# Patient Record
Sex: Male | Born: 1970 | Race: Black or African American | Hispanic: No | Marital: Married | Smoking: Current every day smoker
Health system: Southern US, Community
[De-identification: ages and names within clinical notes are randomized; demographics above are authoritative.]

## PROBLEM LIST (undated history)

## (undated) ENCOUNTER — Emergency Department (HOSPITAL_COMMUNITY): Admission: EM | Payer: Self-pay

## (undated) DIAGNOSIS — G629 Polyneuropathy, unspecified: Secondary | ICD-10-CM

## (undated) DIAGNOSIS — H5440 Blindness, one eye, unspecified eye: Secondary | ICD-10-CM

## (undated) HISTORY — PX: OTHER SURGICAL HISTORY: SHX169

---

## 1997-09-08 ENCOUNTER — Emergency Department (HOSPITAL_COMMUNITY): Admission: EM | Admit: 1997-09-08 | Discharge: 1997-09-08 | Payer: Self-pay | Admitting: Emergency Medicine

## 1998-09-16 ENCOUNTER — Emergency Department (HOSPITAL_COMMUNITY): Admission: EM | Admit: 1998-09-16 | Discharge: 1998-09-16 | Payer: Self-pay | Admitting: Emergency Medicine

## 1999-08-13 ENCOUNTER — Emergency Department (HOSPITAL_COMMUNITY): Admission: EM | Admit: 1999-08-13 | Discharge: 1999-08-13 | Payer: Self-pay | Admitting: Emergency Medicine

## 1999-08-14 ENCOUNTER — Emergency Department (HOSPITAL_COMMUNITY): Admission: EM | Admit: 1999-08-14 | Discharge: 1999-08-14 | Payer: Self-pay | Admitting: Emergency Medicine

## 2000-05-11 ENCOUNTER — Encounter: Payer: Self-pay | Admitting: Emergency Medicine

## 2000-05-11 ENCOUNTER — Emergency Department (HOSPITAL_COMMUNITY): Admission: EM | Admit: 2000-05-11 | Discharge: 2000-05-11 | Payer: Self-pay | Admitting: Emergency Medicine

## 2001-06-01 ENCOUNTER — Emergency Department (HOSPITAL_COMMUNITY): Admission: EM | Admit: 2001-06-01 | Discharge: 2001-06-01 | Payer: Self-pay | Admitting: Emergency Medicine

## 2002-09-11 ENCOUNTER — Encounter: Payer: Self-pay | Admitting: Emergency Medicine

## 2002-09-11 ENCOUNTER — Emergency Department (HOSPITAL_COMMUNITY): Admission: EM | Admit: 2002-09-11 | Discharge: 2002-09-11 | Payer: Self-pay | Admitting: Emergency Medicine

## 2004-09-01 ENCOUNTER — Emergency Department (HOSPITAL_COMMUNITY): Admission: EM | Admit: 2004-09-01 | Discharge: 2004-09-01 | Payer: Self-pay | Admitting: Emergency Medicine

## 2004-10-09 ENCOUNTER — Emergency Department (HOSPITAL_COMMUNITY): Admission: EM | Admit: 2004-10-09 | Discharge: 2004-10-09 | Payer: Self-pay | Admitting: Family Medicine

## 2005-02-08 ENCOUNTER — Emergency Department (HOSPITAL_COMMUNITY): Admission: EM | Admit: 2005-02-08 | Discharge: 2005-02-08 | Payer: Self-pay | Admitting: Emergency Medicine

## 2006-12-13 ENCOUNTER — Emergency Department (HOSPITAL_COMMUNITY): Admission: EM | Admit: 2006-12-13 | Discharge: 2006-12-13 | Payer: Self-pay | Admitting: Emergency Medicine

## 2007-02-24 ENCOUNTER — Emergency Department (HOSPITAL_COMMUNITY): Admission: EM | Admit: 2007-02-24 | Discharge: 2007-02-24 | Payer: Self-pay | Admitting: Emergency Medicine

## 2009-11-12 ENCOUNTER — Emergency Department (HOSPITAL_COMMUNITY): Admission: EM | Admit: 2009-11-12 | Discharge: 2009-11-12 | Payer: Self-pay | Admitting: Emergency Medicine

## 2009-11-14 ENCOUNTER — Emergency Department (HOSPITAL_COMMUNITY): Admission: EM | Admit: 2009-11-14 | Discharge: 2009-11-14 | Payer: Self-pay | Admitting: Emergency Medicine

## 2010-01-30 ENCOUNTER — Emergency Department (HOSPITAL_COMMUNITY)
Admission: EM | Admit: 2010-01-30 | Discharge: 2010-01-30 | Payer: Self-pay | Source: Home / Self Care | Admitting: Emergency Medicine

## 2010-05-01 LAB — URINALYSIS, ROUTINE W REFLEX MICROSCOPIC
Bilirubin Urine: NEGATIVE
Glucose, UA: NEGATIVE mg/dL
Hgb urine dipstick: NEGATIVE
Hgb urine dipstick: NEGATIVE
Ketones, ur: NEGATIVE mg/dL
Nitrite: NEGATIVE
Protein, ur: NEGATIVE mg/dL
Protein, ur: NEGATIVE mg/dL
Urobilinogen, UA: 2 mg/dL — ABNORMAL HIGH (ref 0.0–1.0)

## 2010-05-01 LAB — DIFFERENTIAL
Eosinophils Absolute: 0.1 10*3/uL (ref 0.0–0.7)
Lymphocytes Relative: 27 % (ref 12–46)
Lymphs Abs: 1 10*3/uL (ref 0.7–4.0)
Neutrophils Relative %: 62 % (ref 43–77)

## 2010-05-01 LAB — POCT I-STAT, CHEM 8
Creatinine, Ser: 1.2 mg/dL (ref 0.4–1.5)
HCT: 48 % (ref 39.0–52.0)
Hemoglobin: 16.3 g/dL (ref 13.0–17.0)
Sodium: 139 mEq/L (ref 135–145)
TCO2: 30 mmol/L (ref 0–100)

## 2010-05-01 LAB — BASIC METABOLIC PANEL
Calcium: 9.1 mg/dL (ref 8.4–10.5)
Creatinine, Ser: 1.06 mg/dL (ref 0.4–1.5)
GFR calc Af Amer: >60 mL/min (ref >60)
GFR calc non Af Amer: >60 mL/min (ref >60)

## 2010-05-01 LAB — ETHANOL: Alcohol, Ethyl (B): <5 mg/dL (ref 0–10)

## 2010-05-01 LAB — CBC
Platelets: 203 10*3/uL (ref 150–400)
RBC: 4.48 MIL/uL (ref 4.22–5.81)
WBC: 3.6 10*3/uL — ABNORMAL LOW (ref 4.0–10.5)

## 2010-05-01 LAB — RAPID URINE DRUG SCREEN, HOSP PERFORMED
Amphetamines: NOT DETECTED
Benzodiazepines: NOT DETECTED

## 2011-03-20 ENCOUNTER — Emergency Department (HOSPITAL_COMMUNITY): Payer: Self-pay

## 2011-03-20 ENCOUNTER — Emergency Department (HOSPITAL_COMMUNITY)
Admission: EM | Admit: 2011-03-20 | Discharge: 2011-03-20 | Disposition: A | Discharge status: Home / Self Care | Payer: Self-pay | Attending: Emergency Medicine | Admitting: Emergency Medicine

## 2011-03-20 ENCOUNTER — Encounter (HOSPITAL_COMMUNITY): Payer: Self-pay | Admitting: Emergency Medicine

## 2011-03-20 DIAGNOSIS — K089 Disorder of teeth and supporting structures, unspecified: Secondary | ICD-10-CM | POA: Insufficient documentation

## 2011-03-20 DIAGNOSIS — R220 Localized swelling, mass and lump, head: Secondary | ICD-10-CM | POA: Insufficient documentation

## 2011-03-20 DIAGNOSIS — K047 Periapical abscess without sinus: Secondary | ICD-10-CM | POA: Insufficient documentation

## 2011-03-20 DIAGNOSIS — R6884 Jaw pain: Secondary | ICD-10-CM | POA: Insufficient documentation

## 2011-03-20 DIAGNOSIS — F17200 Nicotine dependence, unspecified, uncomplicated: Secondary | ICD-10-CM | POA: Insufficient documentation

## 2011-03-20 MED ORDER — PENICILLIN V POTASSIUM 500 MG PO TABS: 500.0000 mg | ORAL_TABLET | Freq: Four times a day (QID) | ORAL | Status: AC

## 2011-03-20 MED ORDER — OXYCODONE-ACETAMINOPHEN 5-325 MG PO TABS: 1.0000 | ORAL_TABLET | ORAL | Status: AC | PRN
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
## 2011-03-20 NOTE — ED Notes (Signed)
Left lower tooth pain started  X 1 weeks now jaw is swollen

## 2013-07-23 ENCOUNTER — Emergency Department (HOSPITAL_COMMUNITY): Payer: No Typology Code available for payment source

## 2013-07-23 ENCOUNTER — Encounter (HOSPITAL_COMMUNITY): Payer: Self-pay | Admitting: Emergency Medicine

## 2013-07-23 ENCOUNTER — Emergency Department (HOSPITAL_COMMUNITY)
Admission: EM | Admit: 2013-07-23 | Discharge: 2013-07-23 | Disposition: A | Discharge status: Home / Self Care | Payer: No Typology Code available for payment source | Attending: Emergency Medicine | Admitting: Emergency Medicine

## 2013-07-23 DIAGNOSIS — S79929A Unspecified injury of unspecified thigh, initial encounter: Secondary | ICD-10-CM

## 2013-07-23 DIAGNOSIS — S99929A Unspecified injury of unspecified foot, initial encounter: Principal | ICD-10-CM

## 2013-07-23 DIAGNOSIS — S79919A Unspecified injury of unspecified hip, initial encounter: Secondary | ICD-10-CM | POA: Insufficient documentation

## 2013-07-23 DIAGNOSIS — S8990XA Unspecified injury of unspecified lower leg, initial encounter: Secondary | ICD-10-CM | POA: Insufficient documentation

## 2013-07-23 DIAGNOSIS — F17200 Nicotine dependence, unspecified, uncomplicated: Secondary | ICD-10-CM | POA: Insufficient documentation

## 2013-07-23 DIAGNOSIS — Y92410 Unspecified street and highway as the place of occurrence of the external cause: Secondary | ICD-10-CM | POA: Insufficient documentation

## 2013-07-23 DIAGNOSIS — S20219A Contusion of unspecified front wall of thorax, initial encounter: Secondary | ICD-10-CM

## 2013-07-23 DIAGNOSIS — M25562 Pain in left knee: Secondary | ICD-10-CM

## 2013-07-23 DIAGNOSIS — Y9389 Activity, other specified: Secondary | ICD-10-CM | POA: Insufficient documentation

## 2013-07-23 DIAGNOSIS — IMO0002 Reserved for concepts with insufficient information to code with codable children: Secondary | ICD-10-CM | POA: Insufficient documentation

## 2013-07-23 DIAGNOSIS — S99919A Unspecified injury of unspecified ankle, initial encounter: Principal | ICD-10-CM

## 2013-07-23 MED ORDER — HYDROCODONE-ACETAMINOPHEN 5-500 MG PO TABS: 1.0000 | ORAL_TABLET | Freq: Four times a day (QID) | ORAL | Status: DC | PRN

## 2013-07-23 MED ORDER — HYDROCODONE-ACETAMINOPHEN 5-325 MG PO TABS
2.0000 | ORAL_TABLET | Freq: Once | ORAL | Status: AC
  Administered 2013-07-23: 2 via ORAL
  Filled 2013-07-23: qty 2

## 2013-07-23 MED ORDER — NAPROXEN 500 MG PO TABS: 500.0000 mg | ORAL_TABLET | Freq: Two times a day (BID) | ORAL | Status: DC
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
## 2013-07-23 NOTE — Discharge Instructions (Signed)
RICE: Routine Care for Injuries The routine care of many injuries includes Rest, Ice, Compression, and Elevation (RICE). HOME CARE INSTRUCTIONS  Rest is needed to allow your body to heal. Routine activities can usually be resumed when comfortable. Injured tendons and bones can take up to 6 weeks to heal. Tendons are the cord-like structures that attach muscle to bone.  Ice following an injury helps keep the swelling down and reduces pain.  Put ice in a plastic bag.  Place a towel between your skin and the bag.  Leave the ice on for 15-20 minutes, 03-04 times a day. Do this while awake, for the first 24 to 48 hours. After that, continue as directed by your caregiver.  Compression helps keep swelling down. It also gives support and helps with discomfort. If an elastic bandage has been applied, it should be removed and reapplied every 3 to 4 hours. It should not be applied tightly, but firmly enough to keep swelling down. Watch fingers or toes for swelling, bluish discoloration, coldness, numbness, or excessive pain. If any of these problems occur, remove the bandage and reapply loosely. Contact your caregiver if these problems continue.  Elevation helps reduce swelling and decreases pain. With extremities, such as the arms, hands, legs, and feet, the injured area should be placed near or above the level of the heart, if possible. SEEK IMMEDIATE MEDICAL CARE IF:  You have persistent pain and swelling.  You develop redness, numbness, or unexpected weakness.  Your symptoms are getting worse rather than improving after several days. These symptoms may indicate that further evaluation or further X-rays are needed. Sometimes, X-rays may not show a small broken bone (fracture) until 1 week or 10 days later. Make a follow-up appointment with your caregiver. Ask when your X-ray results will be ready. Make sure you get your X-ray results. Document Released: 05/17/2000 Document Revised: 04/27/2011  Document Reviewed: 07/04/2010 [facility] Patient Information 2014 [place], [place]. Chest Contusion A contusion is a deep bruise. Bruises happen when an injury causes bleeding under the skin. Signs of bruising include pain, puffiness (swelling), and discolored skin. The bruise may turn blue, purple, or yellow.  HOME CARE  Put ice on the injured area.  Put ice in a plastic bag.  Place a towel between the skin and the bag.  Leave the ice on for 15-20 minutes at a time, 03-04 times a day for the first 48 hours.  Only take medicine as told by your doctor.  Rest.  Take deep breaths (deep-breathing exercises) as told by your doctor.  Stop smoking if you smoke.  Do not lift objects over 5 pounds (2.3 kilograms) for 3 days or longer if told by your doctor. GET HELP RIGHT AWAY IF:   You have more bruising or puffiness.  You have pain that gets worse.  You have trouble breathing.  You are dizzy, weak, or pass out (faint).  You have blood in your pee (urine) or poop (stool).  You cough up or throw up (vomit) blood.  Your puffiness or pain is not helped with medicines. MAKE SURE YOU:   Understand these instructions.  Will watch your condition.  Will get help right away if you are not doing well or get worse. Document Released: 07/22/2007 Document Revised: 10/28/2011 Document Reviewed: 07/27/2011 [facility] Patient Information 2014 [place], [place].

## 2013-07-24 NOTE — ED Provider Notes (Signed)
Medical screening examination/treatment/procedure(s) were performed by non-physician practitioner and as supervising physician I was immediately available for consultation/collaboration.  Kalli Greenfield T Lenka Zhao, MD 07/24/13 0957 

## 2013-11-19 ENCOUNTER — Encounter (HOSPITAL_COMMUNITY): Payer: Self-pay | Admitting: Emergency Medicine

## 2013-11-19 ENCOUNTER — Emergency Department (HOSPITAL_COMMUNITY)
Admission: EM | Admit: 2013-11-19 | Discharge: 2013-11-19 | Disposition: A | Discharge status: Home / Self Care | Payer: Self-pay | Attending: Emergency Medicine | Admitting: Emergency Medicine

## 2013-11-19 ENCOUNTER — Emergency Department (HOSPITAL_COMMUNITY): Payer: Self-pay

## 2013-11-19 DIAGNOSIS — Z720 Tobacco use: Secondary | ICD-10-CM | POA: Insufficient documentation

## 2013-11-19 DIAGNOSIS — H538 Other visual disturbances: Secondary | ICD-10-CM | POA: Insufficient documentation

## 2013-11-19 DIAGNOSIS — Z87828 Personal history of other (healed) physical injury and trauma: Secondary | ICD-10-CM | POA: Insufficient documentation

## 2013-11-19 DIAGNOSIS — H5440 Blindness, one eye, unspecified eye: Secondary | ICD-10-CM | POA: Insufficient documentation

## 2013-11-19 DIAGNOSIS — Z0000 Encounter for general adult medical examination without abnormal findings: Secondary | ICD-10-CM | POA: Insufficient documentation

## 2013-11-19 DIAGNOSIS — Z139 Encounter for screening, unspecified: Secondary | ICD-10-CM

## 2013-11-19 DIAGNOSIS — Z79899 Other long term (current) drug therapy: Secondary | ICD-10-CM | POA: Insufficient documentation

## 2013-11-19 HISTORY — DX: Blindness, one eye, unspecified eye: H54.40

## 2013-11-19 MED ORDER — TETRACAINE HCL 0.5 % OP SOLN
2.0000 [drp] | Freq: Once | OPHTHALMIC | Status: AC
  Administered 2013-11-19: 2 [drp] via OPHTHALMIC
  Filled 2013-11-19: qty 2

## 2013-11-19 MED ORDER — FLUORESCEIN SODIUM 1 MG OP STRP
1.0000 | ORAL_STRIP | Freq: Once | OPHTHALMIC | Status: AC
  Administered 2013-11-19: 1 via OPHTHALMIC
  Filled 2013-11-19: qty 1

## 2013-11-19 MED ORDER — ACETAMINOPHEN 500 MG PO TABS
1000.0000 mg | ORAL_TABLET | Freq: Once | ORAL | Status: AC
  Administered 2013-11-19: 1000 mg via ORAL
  Filled 2013-11-19: qty 2
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
## 2013-11-19 NOTE — Discharge Instructions (Signed)
Take acetaminophen (Tylenol) up to 975 mg (this is normally 3 over-the-counter pills) up to 3 times a day. Do not drink alcohol. Make sure your other medications do not contain acetaminophen (Read the labels!) ° °Do not hesitate to return to the emergency room for any new, worsening or concerning symptoms. ° °Please obtain primary care using resource guide below. But the minute you were seen in the emergency room and that they will need to obtain records for further outpatient management. ° ° °Emergency Department Resource Guide °1) Find a Doctor and Pay Out of Pocket °Although you won't have to find out who is covered by your insurance plan, it is a good idea to ask around and get recommendations. You will then need to call the office and see if the doctor you have chosen will accept you as a new patient and what types of options they offer for patients who are self-pay. Some doctors offer discounts or will set up payment plans for their patients who do not have insurance, but you will need to ask so you aren't surprised when you get to your appointment. ° °2) Contact Your Local Health Department °Not all health departments have doctors that can see patients for sick visits, but many do, so it is worth a call to see if yours does. If you don't know where your local health department is, you can check in your phone book. The CDC also has a tool to help you locate your state's health department, and many state websites also have listings of all of their local health departments. ° °3) Find a Walk-in Clinic °If your illness is not likely to be very severe or complicated, you may want to try a walk in clinic. These are popping up all over the country in pharmacies, drugstores, and shopping centers. They're usually staffed by nurse practitioners or physician assistants that have been trained to treat common illnesses and complaints. They're usually fairly quick and inexpensive. However, if you have serious medical  issues or chronic medical problems, these are probably not your best option. ° °No Primary Care Doctor: °- Call [facility] at  832-8000 - they can help you locate a primary care doctor that  accepts your insurance, provides certain services, etc. °- Physician Referral Service- 1-800-533-3463 ° °Chronic Pain Problems: °Organization         Address  Phone   Notes  °[facility]  (336) 297-2271 Patients need to be referred by their primary care doctor.  ° °Medication Assistance: °Organization         Address  Phone   Notes  °[place] [facility] [address] °[place], [place] [address] (336) 641-8030 --Must be a resident of [place] °-- Must have NO insurance coverage whatsoever (no Medicaid/ Medicare, etc.) °-- The pt. MUST have a primary care doctor that directs their care regularly and follows them in the community °  °[facility]  (866) 331-1348   °[employer]  (888) 892-1162   ° °Agencies that provide inexpensive medical care: °Organization         Address  Phone   Notes  °[facility]  (336) 832-8035   °[facility]    (336) 832-7272   °[facility] [address] °[place], [place] [address] (336) 832-4777   °[facility] [address], ° (336) 271-4999   °[facility]    (336) 373-0678   °[facility]    (336) 272-1050   °[facility] ° [address], [place] Phone:  (336) 832-4444, Fax:  (336) 832-4440 Hours of Operation:  9 am - 6 pm, M-F.  Also accepts Medicaid/Medicare and self-pay.  °[facility] ° [address], [place] Phone: (336) 832-3150, Fax: (336) [phone]. Hours of Operation:  8:30 am - 5:30 pm, M-F.  Also accepts Medicaid and self-pay.  °[facility] [place] [address], [place] Phone: (336) 878-6027   °[facility] [address], [place], [place]  (336)723-1848, Ext. 123 Mondays & Thursdays: 7-9 AM.  First 15 patients are seen on a first come, first serve basis. °  ° °Medicaid-accepting [place] Providers: ° °Organization         Address  Phone   Notes  °[facility] [address], [place] (336) 641-2100 Also accepts self-pay patients.  °[facility] [address], [place] ° (336) 856-9996   °[facility] [address], [place] (336) 288-8857   °[facility] [address] [place] [address], [place] (336) 299-7000   °Veita Bland [address], [place]  ° (336) 373-1557 Only accepts [place] Access Medicaid patients after they have their name applied to their card.  ° °Self-Pay (no insurance) in [place]: ° °Organization         Address  Phone   Notes  °Sickle Cell Patients, [facility] [address], [place] (336) 832-1970   °[facility] [address], [place] (336) 832-4400   °[facility] ° [address], [place] (336) 992-4800   °[facility]/Dr. Osei-Bonsu ° [address] [place] [address], [place] or [address], [place] (336) [phone] Phone number for both [place] and [place] locations is the same.  °[facility] [address], [place] (336) 299-0000   °[facility] [address] [place] [address], [place] or [address] (336) 852-7530 °(336) 878-2260   °[facility] [address], [place] (336) 350-1642, phone; (336) 294-5005, fax Sees patients 1st and 3rd Saturday of every month.  Must not qualify for public or private insurance (i.e. Medicaid, Medicare, [place] Health Choice, Veterans' Benefits) • Household income should be no more than 200% of the poverty level •The clinic cannot treat you if you are pregnant or think you are pregnant • Sexually transmitted  diseases are not treated at the clinic.  ° ° °Dental Care: °Organization         Address  Phone  Notes  °[facility] [facility] [address],  (336) 641-6152 Accepts children up to age 21 who are enrolled in Medicaid or [place] Health Choice; pregnant women with a Medicaid card; and children who have applied for Medicaid or [place] Health Choice, but were declined, whose parents can pay a reduced fee at time of service.  °[facility] [place]  [address], [place] (336) 641-7733 Accepts children up to age 21 who are enrolled in Medicaid or [place] Health Choice; pregnant women with a Medicaid card; and children who have applied for Medicaid or [place] Health Choice, but were declined, whose parents can pay a reduced fee at time of   service.  °[facility] ° [address], [place] (336) 641-4533 Patients are seen by appointment only. Walk-ins are not accepted. [facility] will see patients 18 years of age and older. °Monday - Tuesday (8am-5pm) °Most Wednesdays (8:30-5pm) °$30 per visit, cash only  °[facility] ° [address], [place] (336) 641-4533 Patients are seen by appointment only. Walk-ins are not accepted. [facility] will see patients 18 years of age and older. °One Wednesday Evening (Monthly: Volunteer Based).  $30 per visit, cash only  °[facility]  (919) 537-3737 for adults; Children under age 4, call Graduate Pediatric Dentistry at (919) [phone]. Children aged 4-14, please call (919) [phone] to request a pediatric application. ° Dental services are provided in all areas of dental care including fillings, crowns and bridges, complete and partial dentures, implants, gum treatment, root canals, and extractions. Preventive care is also provided. Treatment is provided to both adults and children. °Patients are selected via a  lottery and there is often a waiting list. °  °[facility] [address], °[place] ° (336) 763-8833 [email or web address] °  °[facility] [address], [place], [place] (336)723-1848, Ext. 123 Second and Fourth Thursday of each month, opens at 6:30 AM; Clinic ends at 9 AM.  Patients are seen on a first-come first-served basis, and a limited number are seen during each clinic.  ° °[facility] ° [address], [place], [place] (336) 723-7904   Eligibility Requirements °You must have lived in [place], [place], or [place] counties for at least the last three months. °  You cannot be eligible for state or federal sponsored healthcare insurance, including Veterans Administration, Medicaid, or Medicare. °  You generally cannot be eligible for healthcare insurance through your employer.  °  How to apply: °Eligibility screenings are held every Tuesday and Wednesday afternoon from 1:00 pm until 4:00 pm. You do not need an appointment for the interview!  °[facility] [address], [place], [place] 336-631-2330   °[facility]  336-342-8273   °[facility]  336-703-3100   °[facility]  336-570-6415   ° °Behavioral Health Resources in the Community: °Intensive Outpatient Programs °Organization         Address  Phone  Notes  °[facility] [address], [place], [place] 336-878-6098   °[facility] [address], [place], [place] 336-832-9800   °[facility] [address], [place], [place] ° 336-882-2125   °[facility] [address],  °[place], [place] 1-800-853-5163 or 336-641-4981   °Substance Abuse Resources °Organization         Address  Phone  Notes  °[facility]  336-882-2125   °[facility]  336-784-9470   °[facility]  336-285-9073   °[facility]  336-845-3988   °Residential &  Outpatient Substance Abuse Program  1-800-659-3381   °Psychological Services °Organization         Address  Phone  Notes  °[facility]  336- 832-9600   °[facility]  336- 378-7881   °[facility] [address], [place] 1-800-853-5163 or 336-641-4981   ° °Mobile Crisis Teams °Organization         Address  Phone  Notes  °[facility], Mobile Crisis Care Unit  1-877-626-1772   °Assertive °Psychotherapeutic Services °   [address] [place], [place] 336-834-9664   °Sharon DeEsch [address] °[place] 336-554-5454   ° °Self-Help/Support Groups °Organization         Address  Phone             Notes  °[facility] - variety of support groups  336- 373-1402 Call for more information  °Narcotics Anonymous (NA), [facility] [address], °[place]  2 meetings at this location  ° °Residential Treatment Programs °Organization         Address  Phone  Notes  °[facility] [address],    °[place]  1-866-801-8205   °[facility] ° [address], [place], [place] 704-293-8524   °[facility] [address], [place] 336-845-3988 Admissions: 8am-3pm M-F  °[facility] [address]    °[place], [place] 336-841-1104   °[facility] [address], [place], [place] 336-379-7146   °[facility] [address]  °[place], [place] 336-285-9073   °[facility] - Intensive Outpatient [address], [place], [place] 336-852-3033   °[facility] [address]  °[place], [place] 1-877-615-2722 or 336-784-9470   °[facility] [address] [place], [place] 336-227-7417 Accepts Medicaid  °[facility] [address]  °[place] 1-800-659-3381 Substance Abuse/Addiction Treatment  ° °[place] Behavioral Health Resources °Organization          Address  Phone  Notes  °[facility]  (888) 581-9988   °Julie Brannon, PhD [address] [place], [place]   (336) 349-5553 or (336) 951-0000   °[facility]   [address] °[place], [place] (336) 349-4454   °[facility] [address], [place], [place] (336) 342-8316 Insurance/Medicaid/sponsorship through [facility]  °[facility] [address]                                    [place], [place] (336) 342-8316 Therapy/tele-psych/case  °[facility] [address]  ° [place], [place] (336) 349-2233    °Dr. Arfeen  (336) 349-4544   °[facility] [place]  [employer] [place] [facility] 1) [address],  °2) [address], [place] °3)  [address], [place] (336) 349-3220 °(336) 342-7768 ° °(336) 342-8140   °[place] [facility] (336) 342-1394 or (336) 342-3537 (After Hours)    ° ° ° ° ° °
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
## 2013-11-19 NOTE — ED Notes (Signed)
Bed: WTR6 Expected date:  Expected time:  Means of arrival:  Comments: 

## 2013-11-19 NOTE — ED Notes (Signed)
Pt escorted to discharge window with [employer]. Pt verbalized understanding discharge instructions. In no acute distress.
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
## 2014-11-02 ENCOUNTER — Encounter (HOSPITAL_COMMUNITY): Payer: Self-pay | Admitting: Emergency Medicine

## 2014-11-02 ENCOUNTER — Emergency Department (HOSPITAL_COMMUNITY): Payer: Self-pay

## 2014-11-02 ENCOUNTER — Emergency Department (HOSPITAL_COMMUNITY)
Admission: EM | Admit: 2014-11-02 | Discharge: 2014-11-02 | Disposition: A | Discharge status: Home / Self Care | Payer: Self-pay | Attending: Emergency Medicine | Admitting: Emergency Medicine

## 2014-11-02 DIAGNOSIS — Z720 Tobacco use: Secondary | ICD-10-CM | POA: Insufficient documentation

## 2014-11-02 DIAGNOSIS — Z79899 Other long term (current) drug therapy: Secondary | ICD-10-CM | POA: Insufficient documentation

## 2014-11-02 DIAGNOSIS — H5442 Blindness, left eye, normal vision right eye: Secondary | ICD-10-CM | POA: Insufficient documentation

## 2014-11-02 DIAGNOSIS — J40 Bronchitis, not specified as acute or chronic: Secondary | ICD-10-CM | POA: Insufficient documentation

## 2014-11-02 MED ORDER — ALBUTEROL SULFATE HFA 108 (90 BASE) MCG/ACT IN AERS
1.0000 | INHALATION_SPRAY | RESPIRATORY_TRACT | Status: DC | PRN
  Administered 2014-11-02: 2 via RESPIRATORY_TRACT
  Filled 2014-11-02: qty 6.7

## 2014-11-02 MED ORDER — IBUPROFEN 800 MG PO TABS
800.0000 mg | ORAL_TABLET | Freq: Once | ORAL | Status: AC
  Administered 2014-11-02: 800 mg via ORAL
  Filled 2014-11-02: qty 1

## 2014-11-02 MED ORDER — PREDNISONE 20 MG PO TABS: ORAL_TABLET | ORAL | Status: DC

## 2014-11-02 NOTE — ED Notes (Signed)
Pt c/o coughing and headache onset yesterday. Pt daughter had a virus that has now resolved. Pt cough is productive with yellow colored secretions.

## 2014-11-02 NOTE — ED Notes (Signed)
EDP at the bedside.  ?

## 2014-11-02 NOTE — Discharge Instructions (Signed)
Upper Respiratory Infection, Adult An upper respiratory infection (URI) is also sometimes known as the common cold. The upper respiratory tract includes the nose, sinuses, throat, trachea, and bronchi. Bronchi are the airways leading to the lungs. Most people improve within 1 week, but symptoms can last up to 2 weeks. A residual cough may last even longer.  CAUSES Many different viruses can infect the tissues lining the upper respiratory tract. The tissues become irritated and inflamed and often become very moist. Mucus production is also common. A cold is contagious. You can easily spread the virus to others by oral contact. This includes kissing, sharing a glass, coughing, or sneezing. Touching your mouth or nose and then touching a surface, which is then touched by another person, can also spread the virus. SYMPTOMS  Symptoms typically develop 1 to 3 days after you come in contact with a cold virus. Symptoms vary from person to person. They may include:  Runny nose.  Sneezing.  Nasal congestion.  Sinus irritation.  Sore throat.  Loss of voice (laryngitis).  Cough.  Fatigue.  Muscle aches.  Loss of appetite.  Headache.  Low-grade fever. DIAGNOSIS  You might diagnose your own cold based on familiar symptoms, since most people get a cold 2 to 3 times a year. Your caregiver can confirm this based on your exam. Most importantly, your caregiver can check that your symptoms are not due to another disease such as strep throat, sinusitis, pneumonia, asthma, or epiglottitis. Blood tests, throat tests, and X-rays are not necessary to diagnose a common cold, but they may sometimes be helpful in excluding other more serious diseases. Your caregiver will decide if any further tests are required. RISKS AND COMPLICATIONS  You may be at risk for a more severe case of the common cold if you smoke cigarettes, have chronic heart disease (such as heart failure) or lung disease (such as asthma), or if  you have a weakened immune system. The very young and very old are also at risk for more serious infections. Bacterial sinusitis, middle ear infections, and bacterial pneumonia can complicate the common cold. The common cold can worsen asthma and chronic obstructive pulmonary disease (COPD). Sometimes, these complications can require emergency medical care and may be life-threatening. PREVENTION  The best way to protect against getting a cold is to practice good hygiene. Avoid oral or hand contact with people with cold symptoms. Wash your hands often if contact occurs. There is no clear evidence that vitamin C, vitamin E, echinacea, or exercise reduces the chance of developing a cold. However, it is always recommended to get plenty of rest and practice good nutrition. TREATMENT  Treatment is directed at relieving symptoms. There is no cure. Antibiotics are not effective, because the infection is caused by a virus, not by bacteria. Treatment may include:  Increased fluid intake. Sports drinks offer valuable electrolytes, sugars, and fluids.  Breathing heated mist or steam (vaporizer or shower).  Eating chicken soup or other clear broths, and maintaining good nutrition.  Getting plenty of rest.  Using gargles or lozenges for comfort.  Controlling fevers with ibuprofen or acetaminophen as directed by your caregiver.  Increasing usage of your inhaler if you have asthma. Zinc gel and zinc lozenges, taken in the first 24 hours of the common cold, can shorten the duration and lessen the severity of symptoms. Pain medicines may help with fever, muscle aches, and throat pain. A variety of non-prescription medicines are available to treat congestion and runny nose. Your caregiver   can make recommendations and may suggest nasal or lung inhalers for other symptoms.  HOME CARE INSTRUCTIONS   Only take over-the-counter or prescription medicines for pain, discomfort, or fever as directed by your  caregiver.  Use a warm mist humidifier or inhale steam from a shower to increase air moisture. This may keep secretions moist and make it easier to breathe.  Drink enough water and fluids to keep your urine clear or pale yellow.  Rest as needed.  Return to work when your temperature has returned to normal or as your caregiver advises. You may need to stay home longer to avoid infecting others. You can also use a face mask and careful hand washing to prevent spread of the virus. SEEK MEDICAL CARE IF:   After the first few days, you feel you are getting worse rather than better.  You need your caregiver's advice about medicines to control symptoms.  You develop chills, worsening shortness of breath, or brown or red sputum. These may be signs of pneumonia.  You develop yellow or brown nasal discharge or pain in the face, especially when you bend forward. These may be signs of sinusitis.  You develop a fever, swollen neck glands, pain with swallowing, or white areas in the back of your throat. These may be signs of strep throat. SEEK IMMEDIATE MEDICAL CARE IF:   You have a fever.  You develop severe or persistent headache, ear pain, sinus pain, or chest pain.  You develop wheezing, a prolonged cough, cough up blood, or have a change in your usual mucus (if you have chronic lung disease).  You develop sore muscles or a stiff neck. Document Released: 07/29/2000 Document Revised: 04/27/2011 Document Reviewed: 05/10/2013 [employer] Patient Information 2015 [employer]. This information is not intended to replace advice given to you by your health care provider. Make sure you discuss any questions you have with your health care provider.  
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
## 2015-04-17 ENCOUNTER — Emergency Department (HOSPITAL_COMMUNITY)
Admission: EM | Admit: 2015-04-17 | Discharge: 2015-04-17 | Disposition: A | Discharge status: Home / Self Care | Payer: No Typology Code available for payment source | Attending: Physician Assistant | Admitting: Physician Assistant

## 2015-04-17 ENCOUNTER — Encounter (HOSPITAL_COMMUNITY): Payer: Self-pay | Admitting: Family Medicine

## 2015-04-17 DIAGNOSIS — F17200 Nicotine dependence, unspecified, uncomplicated: Secondary | ICD-10-CM | POA: Insufficient documentation

## 2015-04-17 DIAGNOSIS — J029 Acute pharyngitis, unspecified: Secondary | ICD-10-CM

## 2015-04-17 DIAGNOSIS — Z7952 Long term (current) use of systemic steroids: Secondary | ICD-10-CM | POA: Insufficient documentation

## 2015-04-17 DIAGNOSIS — Z8669 Personal history of other diseases of the nervous system and sense organs: Secondary | ICD-10-CM | POA: Insufficient documentation

## 2015-04-17 DIAGNOSIS — M791 Myalgia: Secondary | ICD-10-CM | POA: Insufficient documentation

## 2015-04-17 DIAGNOSIS — R110 Nausea: Secondary | ICD-10-CM | POA: Insufficient documentation

## 2015-04-17 DIAGNOSIS — Z79899 Other long term (current) drug therapy: Secondary | ICD-10-CM | POA: Insufficient documentation

## 2015-04-17 LAB — RAPID STREP SCREEN (MED CTR MEBANE ONLY): STREPTOCOCCUS, GROUP A SCREEN (DIRECT): NEGATIVE

## 2015-04-17 MED ORDER — IBUPROFEN 800 MG PO TABS: 800.0000 mg | ORAL_TABLET | Freq: Three times a day (TID) | ORAL | Status: DC

## 2015-04-17 MED ORDER — LIDOCAINE VISCOUS 2 % MT SOLN: 20.0000 mL | OROMUCOSAL | Status: DC | PRN

## 2015-04-17 MED ORDER — LIDOCAINE VISCOUS 2 % MT SOLN
15.0000 mL | Freq: Once | OROMUCOSAL | Status: AC
  Administered 2015-04-17: 15 mL via OROMUCOSAL
  Filled 2015-04-17: qty 15

## 2015-04-17 MED ORDER — DEXAMETHASONE SODIUM PHOSPHATE 10 MG/ML IJ SOLN
10.0000 mg | Freq: Once | INTRAMUSCULAR | Status: AC
  Administered 2015-04-17: 10 mg via INTRAMUSCULAR
  Filled 2015-04-17: qty 1

## 2015-04-17 NOTE — ED Notes (Signed)
Pt here for cough, sore throat and generalized body aches that started yesterday.

## 2015-04-17 NOTE — ED Notes (Signed)
Declined W/C at D/C and was escorted to lobby by RN. 

## 2015-04-17 NOTE — Discharge Instructions (Signed)
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
## 2015-04-19 LAB — CULTURE, GROUP A STREP (THRC)

## 2015-08-04 ENCOUNTER — Other Ambulatory Visit: Payer: Self-pay

## 2015-08-04 ENCOUNTER — Encounter (HOSPITAL_COMMUNITY): Payer: Self-pay | Admitting: Emergency Medicine

## 2015-08-04 ENCOUNTER — Emergency Department (HOSPITAL_COMMUNITY)
Admission: EM | Admit: 2015-08-04 | Discharge: 2015-08-04 | Disposition: A | Discharge status: Home / Self Care | Payer: No Typology Code available for payment source | Attending: Dermatology | Admitting: Dermatology

## 2015-08-04 DIAGNOSIS — Z5321 Procedure and treatment not carried out due to patient leaving prior to being seen by health care provider: Secondary | ICD-10-CM | POA: Insufficient documentation

## 2015-08-04 DIAGNOSIS — R079 Chest pain, unspecified: Secondary | ICD-10-CM | POA: Insufficient documentation

## 2015-08-04 DIAGNOSIS — F17200 Nicotine dependence, unspecified, uncomplicated: Secondary | ICD-10-CM | POA: Insufficient documentation

## 2015-08-04 LAB — BASIC METABOLIC PANEL
ANION GAP: 10 (ref 5–15)
BUN: 11 mg/dL (ref 6–20)
CO2: 25 mmol/L (ref 22–32)
Calcium: 9.5 mg/dL (ref 8.9–10.3)
Chloride: 104 mmol/L (ref 101–111)
Creatinine, Ser: 1.01 mg/dL (ref 0.61–1.24)
GFR calc non Af Amer: >60 mL/min (ref >60)
Glucose, Bld: 102 mg/dL — ABNORMAL HIGH (ref 65–99)
Potassium: 3.2 mmol/L — ABNORMAL LOW (ref 3.5–5.1)
SODIUM: 139 mmol/L (ref 135–145)

## 2015-08-04 LAB — CBC
HCT: 41.6 % (ref 39.0–52.0)
HEMOGLOBIN: 13.8 g/dL (ref 13.0–17.0)
MCH: 32.4 pg (ref 26.0–34.0)
MCHC: 33.2 g/dL (ref 30.0–36.0)
MCV: 97.7 fL (ref 78.0–100.0)
Platelets: 233 10*3/uL (ref 150–400)
RBC: 4.26 MIL/uL (ref 4.22–5.81)
RDW: 13.3 % (ref 11.5–15.5)
WBC: 4 10*3/uL (ref 4.0–10.5)

## 2015-08-04 LAB — I-STAT TROPONIN, ED: Troponin i, poc: 0 ng/mL (ref 0.00–0.08)

## 2015-08-04 NOTE — ED Notes (Signed)
Patient arrives with complaint of left chest pain. States onset 2 hrs ago. Believes it may be heart burn, but he is not sure. Denies history of cardiac problems.

## 2015-08-04 NOTE — ED Notes (Signed)
Unable to locate patient when called for room 

## 2016-08-05 ENCOUNTER — Emergency Department (HOSPITAL_COMMUNITY): Payer: No Typology Code available for payment source

## 2016-08-05 ENCOUNTER — Encounter (HOSPITAL_COMMUNITY): Payer: Self-pay

## 2016-08-05 ENCOUNTER — Emergency Department (HOSPITAL_COMMUNITY)
Admission: EM | Admit: 2016-08-05 | Discharge: 2016-08-05 | Disposition: A | Discharge status: Home / Self Care | Payer: No Typology Code available for payment source | Attending: Emergency Medicine | Admitting: Emergency Medicine

## 2016-08-05 DIAGNOSIS — M25512 Pain in left shoulder: Secondary | ICD-10-CM | POA: Diagnosis not present

## 2016-08-05 DIAGNOSIS — F17200 Nicotine dependence, unspecified, uncomplicated: Secondary | ICD-10-CM | POA: Diagnosis not present

## 2016-08-05 MED ORDER — KETOROLAC TROMETHAMINE 30 MG/ML IJ SOLN
15.0000 mg | Freq: Once | INTRAMUSCULAR | Status: AC
  Administered 2016-08-05: 15 mg via INTRAMUSCULAR
  Filled 2016-08-05: qty 1

## 2016-08-05 MED ORDER — DIAZEPAM 5 MG PO TABS
5.0000 mg | ORAL_TABLET | Freq: Once | ORAL | Status: AC
  Administered 2016-08-05: 5 mg via ORAL
  Filled 2016-08-05: qty 1

## 2016-08-05 MED ORDER — CYCLOBENZAPRINE HCL 10 MG PO TABS: 10.0000 mg | ORAL_TABLET | Freq: Three times a day (TID) | ORAL | 0 refills | Status: DC | PRN

## 2016-08-05 MED ORDER — METHYLPREDNISOLONE 4 MG PO TBPK: ORAL_TABLET | ORAL | 0 refills | Status: DC
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
## 2016-08-05 NOTE — Discharge Instructions (Signed)
Your imaging is normal. He did have signs of arthritis. Please the the Flexeril for muscle relaxation. This medication will make you drowsy so avoid situation that could place you in danger. Take the Medrol Dosepak for inflammation. Alternate Motrin and Tylenol. Please rest, ice, compress and elevated the affected body part to help with swelling and pain. Follow with orthopedist. Return to the ED for worsening symptoms.

## 2016-08-05 NOTE — ED Triage Notes (Signed)
Pt reports left shoulder pain. He states the pain originated in the back of his neck and moves to his shoulder, only with movement. No pain when not moving it. He cant think of a recent injury. No obvious deformity. Denies CP/SOB. Pain ongoing "for months."

## 2016-08-05 NOTE — ED Notes (Signed)
Patient to xray.

## 2017-12-31 ENCOUNTER — Emergency Department (HOSPITAL_COMMUNITY)
Admission: EM | Admit: 2017-12-31 | Discharge: 2018-01-01 | Disposition: A | Discharge status: Home / Self Care | Payer: No Typology Code available for payment source | Attending: Emergency Medicine | Admitting: Emergency Medicine

## 2017-12-31 ENCOUNTER — Other Ambulatory Visit: Payer: Self-pay

## 2017-12-31 DIAGNOSIS — F1020 Alcohol dependence, uncomplicated: Secondary | ICD-10-CM | POA: Insufficient documentation

## 2017-12-31 DIAGNOSIS — F17210 Nicotine dependence, cigarettes, uncomplicated: Secondary | ICD-10-CM | POA: Insufficient documentation

## 2017-12-31 DIAGNOSIS — Y906 Blood alcohol level of 120-199 mg/100 ml: Secondary | ICD-10-CM | POA: Insufficient documentation

## 2017-12-31 DIAGNOSIS — R112 Nausea with vomiting, unspecified: Secondary | ICD-10-CM | POA: Insufficient documentation

## 2017-12-31 DIAGNOSIS — K2920 Alcoholic gastritis without bleeding: Secondary | ICD-10-CM

## 2017-12-31 MED ORDER — ONDANSETRON HCL 4 MG/2ML IJ SOLN
4.0000 mg | Freq: Once | INTRAMUSCULAR | Status: AC
  Administered 2018-01-01: 4 mg via INTRAVENOUS
  Filled 2017-12-31: qty 2

## 2017-12-31 MED ORDER — LORAZEPAM 2 MG/ML IJ SOLN
0.0000 mg | Freq: Four times a day (QID) | INTRAMUSCULAR | Status: DC
  Administered 2018-01-01: 1 mg via INTRAVENOUS
  Filled 2017-12-31: qty 1

## 2017-12-31 MED ORDER — LORAZEPAM 2 MG/ML IJ SOLN: 0.0000 mg | Freq: Two times a day (BID) | INTRAMUSCULAR | Status: DC

## 2017-12-31 MED ORDER — THIAMINE HCL 100 MG/ML IJ SOLN
100.0000 mg | Freq: Every day | INTRAMUSCULAR | Status: DC
  Filled 2017-12-31: qty 2

## 2017-12-31 MED ORDER — SODIUM CHLORIDE 0.9 % IV BOLUS
1000.0000 mL | Freq: Once | INTRAVENOUS | Status: AC
  Administered 2018-01-01: 1000 mL via INTRAVENOUS

## 2017-12-31 MED ORDER — FAMOTIDINE IN NACL 20-0.9 MG/50ML-% IV SOLN
20.0000 mg | INTRAVENOUS | Status: AC
  Administered 2018-01-01: 20 mg via INTRAVENOUS
  Filled 2017-12-31: qty 50

## 2017-12-31 MED ORDER — LORAZEPAM 1 MG PO TABS: 0.0000 mg | ORAL_TABLET | Freq: Four times a day (QID) | ORAL | Status: DC

## 2017-12-31 MED ORDER — LORAZEPAM 1 MG PO TABS: 0.0000 mg | ORAL_TABLET | Freq: Two times a day (BID) | ORAL | Status: DC

## 2017-12-31 MED ORDER — VITAMIN B-1 100 MG PO TABS: 100.0000 mg | ORAL_TABLET | Freq: Every day | ORAL | Status: DC

## 2017-12-31 NOTE — ED Triage Notes (Signed)
Called out for nausea and vomiting x 4 days and loss of appetite x 7 months. Patient drinks a fifth of vodka a day per spouse
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
## 2018-01-01 LAB — CBC
HEMATOCRIT: 38.7 % — AB (ref 39.0–52.0)
Hemoglobin: 12.9 g/dL — ABNORMAL LOW (ref 13.0–17.0)
MCH: 37.1 pg — ABNORMAL HIGH (ref 26.0–34.0)
MCHC: 33.3 g/dL (ref 30.0–36.0)
MCV: 111.2 fL — ABNORMAL HIGH (ref 80.0–100.0)
Platelets: 174 10*3/uL (ref 150–400)
RBC: 3.48 MIL/uL — ABNORMAL LOW (ref 4.22–5.81)
RDW: 14.3 % (ref 11.5–15.5)
WBC: 3.4 10*3/uL — ABNORMAL LOW (ref 4.0–10.5)
nRBC: 0 % (ref 0.0–0.2)

## 2018-01-01 LAB — COMPREHENSIVE METABOLIC PANEL WITH GFR
ALT: 19 U/L (ref 0–44)
AST: 89 U/L — ABNORMAL HIGH (ref 15–41)
Albumin: 4.8 g/dL (ref 3.5–5.0)
Alkaline Phosphatase: 72 U/L (ref 38–126)
Anion gap: 26 — ABNORMAL HIGH (ref 5–15)
BUN: 15 mg/dL (ref 6–20)
CO2: 20 mmol/L — ABNORMAL LOW (ref 22–32)
Calcium: 9.3 mg/dL (ref 8.9–10.3)
Chloride: 94 mmol/L — ABNORMAL LOW (ref 98–111)
Creatinine, Ser: 1.02 mg/dL (ref 0.61–1.24)
GFR calc Af Amer: >60 mL/min
GFR calc non Af Amer: >60 mL/min
Glucose, Bld: 72 mg/dL (ref 70–99)
Potassium: 4.6 mmol/L (ref 3.5–5.1)
Sodium: 140 mmol/L (ref 135–145)
Total Bilirubin: 1.9 mg/dL — ABNORMAL HIGH (ref 0.3–1.2)
Total Protein: 8.7 g/dL — ABNORMAL HIGH (ref 6.5–8.1)

## 2018-01-01 LAB — ETHANOL: Alcohol, Ethyl (B): 179 mg/dL — ABNORMAL HIGH (ref <10)

## 2018-01-01 LAB — LIPASE, BLOOD: Lipase: 31 U/L (ref 11–51)

## 2018-01-01 MED ORDER — FAMOTIDINE 20 MG PO TABS: 20.0000 mg | ORAL_TABLET | Freq: Two times a day (BID) | ORAL | 0 refills | Status: DC

## 2018-01-01 MED ORDER — ONDANSETRON 4 MG PO TBDP: 4.0000 mg | ORAL_TABLET | Freq: Three times a day (TID) | ORAL | 0 refills | Status: DC | PRN

## 2018-01-02 ENCOUNTER — Emergency Department (HOSPITAL_COMMUNITY)
Admission: EM | Admit: 2018-01-02 | Discharge: 2018-01-02 | Disposition: A | Discharge status: Home / Self Care | Payer: No Typology Code available for payment source | Attending: Emergency Medicine | Admitting: Emergency Medicine

## 2018-01-02 ENCOUNTER — Encounter (HOSPITAL_COMMUNITY): Payer: Self-pay | Admitting: Emergency Medicine

## 2018-01-02 ENCOUNTER — Other Ambulatory Visit: Payer: Self-pay

## 2018-01-02 DIAGNOSIS — M79672 Pain in left foot: Secondary | ICD-10-CM | POA: Insufficient documentation

## 2018-01-02 DIAGNOSIS — G621 Alcoholic polyneuropathy: Secondary | ICD-10-CM | POA: Insufficient documentation

## 2018-01-02 DIAGNOSIS — F17200 Nicotine dependence, unspecified, uncomplicated: Secondary | ICD-10-CM | POA: Insufficient documentation

## 2018-01-02 LAB — CBC WITH DIFFERENTIAL/PLATELET
Abs Immature Granulocytes: 0.01 10*3/uL (ref 0.00–0.07)
BASOS ABS: 0 10*3/uL (ref 0.0–0.1)
BASOS PCT: 0 %
EOS PCT: 0 %
Eosinophils Absolute: 0 10*3/uL (ref 0.0–0.5)
HEMATOCRIT: 35.2 % — AB (ref 39.0–52.0)
Hemoglobin: 11.7 g/dL — ABNORMAL LOW (ref 13.0–17.0)
Immature Granulocytes: 0 %
Lymphocytes Relative: 18 %
Lymphs Abs: 0.7 10*3/uL (ref 0.7–4.0)
MCH: 36.8 pg — ABNORMAL HIGH (ref 26.0–34.0)
MCHC: 33.2 g/dL (ref 30.0–36.0)
MCV: 110.7 fL — ABNORMAL HIGH (ref 80.0–100.0)
Monocytes Absolute: 0.5 10*3/uL (ref 0.1–1.0)
Monocytes Relative: 11 %
NRBC: 0 % (ref 0.0–0.2)
Neutro Abs: 2.8 10*3/uL (ref 1.7–7.7)
Neutrophils Relative %: 71 %
Platelets: 152 10*3/uL (ref 150–400)
RBC: 3.18 MIL/uL — AB (ref 4.22–5.81)
RDW: 13.9 % (ref 11.5–15.5)
WBC: 4.1 10*3/uL (ref 4.0–10.5)

## 2018-01-02 LAB — BASIC METABOLIC PANEL
Anion gap: 12 (ref 5–15)
BUN: 10 mg/dL (ref 6–20)
CALCIUM: 9.4 mg/dL (ref 8.9–10.3)
CO2: 29 mmol/L (ref 22–32)
CREATININE: 0.88 mg/dL (ref 0.61–1.24)
Chloride: 98 mmol/L (ref 98–111)
GFR calc non Af Amer: >60 mL/min (ref >60)
Glucose, Bld: 96 mg/dL (ref 70–99)
Potassium: 3.3 mmol/L — ABNORMAL LOW (ref 3.5–5.1)
Sodium: 139 mmol/L (ref 135–145)

## 2018-01-02 LAB — MAGNESIUM: Magnesium: 1.5 mg/dL — ABNORMAL LOW (ref 1.7–2.4)

## 2018-01-02 LAB — TSH: TSH: 1.993 u[IU]/mL (ref 0.350–4.500)

## 2018-01-02 MED ORDER — GABAPENTIN 100 MG PO CAPS
100.0000 mg | ORAL_CAPSULE | Freq: Once | ORAL | Status: AC
  Administered 2018-01-02: 100 mg via ORAL
  Filled 2018-01-02: qty 1

## 2018-01-02 MED ORDER — HYDROCODONE-ACETAMINOPHEN 5-325 MG PO TABS
2.0000 | ORAL_TABLET | Freq: Once | ORAL | Status: AC
  Administered 2018-01-02: 2 via ORAL
  Filled 2018-01-02: qty 2

## 2018-01-02 MED ORDER — LORAZEPAM 1 MG PO TABS
1.0000 mg | ORAL_TABLET | Freq: Once | ORAL | Status: AC
  Administered 2018-01-02: 1 mg via ORAL
  Filled 2018-01-02: qty 1

## 2018-01-02 MED ORDER — VITAMIN B-1 100 MG PO TABS: 100.0000 mg | ORAL_TABLET | Freq: Every day | ORAL | 0 refills | Status: DC

## 2018-01-02 MED ORDER — GABAPENTIN 100 MG PO CAPS: 100.0000 mg | ORAL_CAPSULE | Freq: Three times a day (TID) | ORAL | 0 refills | Status: DC

## 2018-01-02 MED ORDER — SODIUM CHLORIDE 0.9 % IV BOLUS: 1000.0000 mL | Freq: Once | INTRAVENOUS | Status: DC

## 2018-01-02 MED ORDER — LORAZEPAM 2 MG/ML IJ SOLN
1.0000 mg | Freq: Once | INTRAMUSCULAR | Status: AC
  Administered 2018-01-02: 1 mg via INTRAVENOUS
  Filled 2018-01-02: qty 1

## 2018-01-02 MED ORDER — MULTIVITAMINS PO CAPS: 1.0000 | ORAL_CAPSULE | Freq: Every day | ORAL | 0 refills | Status: DC

## 2018-01-02 MED ORDER — MAGNESIUM OXIDE 400 (241.3 MG) MG PO TABS
400.0000 mg | ORAL_TABLET | Freq: Once | ORAL | Status: AC
  Administered 2018-01-02: 400 mg via ORAL
  Filled 2018-01-02: qty 1

## 2018-01-02 MED ORDER — POTASSIUM CHLORIDE CRYS ER 20 MEQ PO TBCR
40.0000 meq | EXTENDED_RELEASE_TABLET | Freq: Once | ORAL | Status: AC
  Administered 2018-01-02: 40 meq via ORAL
  Filled 2018-01-02: qty 2

## 2018-01-02 NOTE — Discharge Instructions (Addendum)
As we discussed, I suspect your pain is due to alcohol-related nerve damage.  It is important to follow-up with a regular doctor for further treatment.  We can start you on a low-dose of medication to help with this.  This medication can take several weeks to truly take effect, but can ultimately be very effective.  He should also take the multivitamins and thiamine to help with your electrolytes, which could be further contributing.  You can take over-the-counter Tylenol and Advil for pain as well.  Be careful when drinking while taking the gabapentin, as this can cause more sedation.

## 2018-01-02 NOTE — ED Triage Notes (Signed)
Pt reports having foot pain for the last month denies any injury. Pt reports having burning pain in both feet.
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
## 2018-01-16 ENCOUNTER — Emergency Department (HOSPITAL_COMMUNITY)
Admission: EM | Admit: 2018-01-16 | Discharge: 2018-01-16 | Disposition: A | Discharge status: Home / Self Care | Payer: Self-pay | Attending: Emergency Medicine | Admitting: Emergency Medicine

## 2018-01-16 ENCOUNTER — Other Ambulatory Visit: Payer: Self-pay

## 2018-01-16 ENCOUNTER — Encounter (HOSPITAL_COMMUNITY): Payer: Self-pay

## 2018-01-16 DIAGNOSIS — Z79899 Other long term (current) drug therapy: Secondary | ICD-10-CM | POA: Insufficient documentation

## 2018-01-16 DIAGNOSIS — F17200 Nicotine dependence, unspecified, uncomplicated: Secondary | ICD-10-CM | POA: Insufficient documentation

## 2018-01-16 DIAGNOSIS — G621 Alcoholic polyneuropathy: Secondary | ICD-10-CM | POA: Insufficient documentation

## 2018-01-16 DIAGNOSIS — R630 Anorexia: Secondary | ICD-10-CM | POA: Insufficient documentation

## 2018-01-16 DIAGNOSIS — R000 Tachycardia, unspecified: Secondary | ICD-10-CM | POA: Insufficient documentation

## 2018-01-16 LAB — COMPREHENSIVE METABOLIC PANEL
ALBUMIN: 4.9 g/dL (ref 3.5–5.0)
ALT: 14 U/L (ref 0–44)
AST: 77 U/L — AB (ref 15–41)
Alkaline Phosphatase: 87 U/L (ref 38–126)
Anion gap: 18 — ABNORMAL HIGH (ref 5–15)
BUN: 8 mg/dL (ref 6–20)
CHLORIDE: 100 mmol/L (ref 98–111)
CO2: 24 mmol/L (ref 22–32)
Calcium: 9.3 mg/dL (ref 8.9–10.3)
Creatinine, Ser: 0.82 mg/dL (ref 0.61–1.24)
GFR calc Af Amer: >60 mL/min (ref >60)
GFR calc non Af Amer: >60 mL/min (ref >60)
GLUCOSE: 86 mg/dL (ref 70–99)
Potassium: 3.6 mmol/L (ref 3.5–5.1)
SODIUM: 142 mmol/L (ref 135–145)
Total Bilirubin: 1.3 mg/dL — ABNORMAL HIGH (ref 0.3–1.2)
Total Protein: 9.2 g/dL — ABNORMAL HIGH (ref 6.5–8.1)

## 2018-01-16 LAB — CBC WITH DIFFERENTIAL/PLATELET
Abs Immature Granulocytes: 0.02 10*3/uL (ref 0.00–0.07)
Basophils Absolute: 0 10*3/uL (ref 0.0–0.1)
Basophils Relative: 0 %
Eosinophils Absolute: 0 10*3/uL (ref 0.0–0.5)
Eosinophils Relative: 0 %
HEMATOCRIT: 38.5 % — AB (ref 39.0–52.0)
HEMOGLOBIN: 12.9 g/dL — AB (ref 13.0–17.0)
Immature Granulocytes: 0 %
LYMPHS ABS: 1.1 10*3/uL (ref 0.7–4.0)
LYMPHS PCT: 16 %
MCH: 36.5 pg — ABNORMAL HIGH (ref 26.0–34.0)
MCHC: 33.5 g/dL (ref 30.0–36.0)
MCV: 109.1 fL — ABNORMAL HIGH (ref 80.0–100.0)
MONO ABS: 0.4 10*3/uL (ref 0.1–1.0)
Monocytes Relative: 6 %
Neutro Abs: 4.9 10*3/uL (ref 1.7–7.7)
Neutrophils Relative %: 78 %
Platelets: 422 10*3/uL — ABNORMAL HIGH (ref 150–400)
RBC: 3.53 MIL/uL — ABNORMAL LOW (ref 4.22–5.81)
RDW: 14.6 % (ref 11.5–15.5)
WBC: 6.4 10*3/uL (ref 4.0–10.5)
nRBC: 0 % (ref 0.0–0.2)

## 2018-01-16 MED ORDER — HYDROMORPHONE HCL 1 MG/ML IJ SOLN
0.5000 mg | Freq: Once | INTRAMUSCULAR | Status: AC
  Administered 2018-01-16: 0.5 mg via INTRAVENOUS
  Filled 2018-01-16: qty 1

## 2018-01-16 MED ORDER — GABAPENTIN 100 MG PO CAPS: 200.0000 mg | ORAL_CAPSULE | Freq: Three times a day (TID) | ORAL | 0 refills | Status: DC

## 2018-01-16 MED ORDER — THIAMINE HCL 100 MG/ML IJ SOLN
Freq: Once | INTRAVENOUS | Status: AC
  Administered 2018-01-16: 02:00:00 via INTRAVENOUS
  Filled 2018-01-16: qty 1000

## 2018-01-16 MED ORDER — THIAMINE HCL 100 MG/ML IJ SOLN: 100.0000 mg | Freq: Every day | INTRAMUSCULAR | Status: DC

## 2018-01-16 MED ORDER — VITAMIN B-1 100 MG PO TABS: 100.0000 mg | ORAL_TABLET | Freq: Every day | ORAL | Status: DC

## 2018-01-16 MED ORDER — LORAZEPAM 2 MG/ML IJ SOLN: 0.0000 mg | Freq: Two times a day (BID) | INTRAMUSCULAR | Status: DC

## 2018-01-16 MED ORDER — LORAZEPAM 1 MG PO TABS: 0.0000 mg | ORAL_TABLET | Freq: Four times a day (QID) | ORAL | Status: DC

## 2018-01-16 MED ORDER — MORPHINE SULFATE (PF) 4 MG/ML IV SOLN
4.0000 mg | Freq: Once | INTRAVENOUS | Status: AC
  Administered 2018-01-16: 4 mg via INTRAVENOUS
  Filled 2018-01-16: qty 1

## 2018-01-16 MED ORDER — HYDROCODONE-ACETAMINOPHEN 5-325 MG PO TABS: 1.0000 | ORAL_TABLET | ORAL | 0 refills | Status: DC | PRN

## 2018-01-16 MED ORDER — LORAZEPAM 2 MG/ML IJ SOLN: 0.0000 mg | Freq: Four times a day (QID) | INTRAMUSCULAR | Status: DC

## 2018-01-16 MED ORDER — SODIUM CHLORIDE 0.9 % IV BOLUS
1000.0000 mL | Freq: Once | INTRAVENOUS | Status: AC
  Administered 2018-01-16: 1000 mL via INTRAVENOUS

## 2018-01-16 MED ORDER — LORAZEPAM 1 MG PO TABS: 0.0000 mg | ORAL_TABLET | Freq: Two times a day (BID) | ORAL | Status: DC
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
## 2018-01-16 NOTE — ED Notes (Signed)
Opened chart at request of pt due to "not having his discharge paperwork and prescriptions" after reviewing chart offered to reprint d/c instructions not scripts. Upon taking instructions to frotn, registration had found originals and was calling pt.

## 2018-01-16 NOTE — Discharge Instructions (Addendum)
Take the neurontin as prescribed, which is a higher dose that you were on previously. You can take Norco as needed for severe pain but these are limited in number that can be prescribed. It is very important that you follow up with a primary care doctor and a list of offices is provided for you to use. You have also been given resources for alcohol treatment should you decide that quitting is something you want to pursue.

## 2018-01-16 NOTE — ED Triage Notes (Signed)
Pt reports continued bilateral foot pain. He describes it as a burning in the soles of his feet and soreness in the heels. Reports that it is keeping him from sleeping. Pt has been seen for same and wife states that they have not followed up. Pt also states that his toes are darker than normal.

## 2018-01-31 ENCOUNTER — Emergency Department (HOSPITAL_COMMUNITY)
Admission: EM | Admit: 2018-01-31 | Discharge: 2018-02-01 | Disposition: A | Discharge status: Home / Self Care | Payer: Self-pay | Attending: Emergency Medicine | Admitting: Emergency Medicine

## 2018-01-31 ENCOUNTER — Encounter (HOSPITAL_COMMUNITY): Payer: Self-pay | Admitting: Emergency Medicine

## 2018-01-31 DIAGNOSIS — E538 Deficiency of other specified B group vitamins: Secondary | ICD-10-CM | POA: Insufficient documentation

## 2018-01-31 DIAGNOSIS — R9431 Abnormal electrocardiogram [ECG] [EKG]: Secondary | ICD-10-CM | POA: Insufficient documentation

## 2018-01-31 DIAGNOSIS — F17200 Nicotine dependence, unspecified, uncomplicated: Secondary | ICD-10-CM | POA: Insufficient documentation

## 2018-01-31 DIAGNOSIS — G629 Polyneuropathy, unspecified: Secondary | ICD-10-CM | POA: Insufficient documentation

## 2018-01-31 DIAGNOSIS — E876 Hypokalemia: Secondary | ICD-10-CM | POA: Insufficient documentation

## 2018-01-31 NOTE — ED Triage Notes (Signed)
Pt reports continued bilateral foot pain.  "MY feet are on fire...  They hurt so much I can't walk."  Pt has an appointment w/ the "foot doctor" this Saturday.  Pt was able to walk back to triage

## 2018-02-01 LAB — CBC WITH DIFFERENTIAL/PLATELET
Abs Immature Granulocytes: 0.01 10*3/uL (ref 0.00–0.07)
Basophils Absolute: 0 10*3/uL (ref 0.0–0.1)
Basophils Relative: 0 %
Eosinophils Absolute: 0.1 10*3/uL (ref 0.0–0.5)
Eosinophils Relative: 2 %
HCT: 39.7 % (ref 39.0–52.0)
HEMOGLOBIN: 12.9 g/dL — AB (ref 13.0–17.0)
Immature Granulocytes: 0 %
Lymphocytes Relative: 42 %
Lymphs Abs: 1.2 10*3/uL (ref 0.7–4.0)
MCH: 34.4 pg — ABNORMAL HIGH (ref 26.0–34.0)
MCHC: 32.5 g/dL (ref 30.0–36.0)
MCV: 105.9 fL — ABNORMAL HIGH (ref 80.0–100.0)
Monocytes Absolute: 0.3 10*3/uL (ref 0.1–1.0)
Monocytes Relative: 12 %
Neutro Abs: 1.3 10*3/uL — ABNORMAL LOW (ref 1.7–7.7)
Neutrophils Relative %: 44 %
Platelets: 292 10*3/uL (ref 150–400)
RBC: 3.75 MIL/uL — AB (ref 4.22–5.81)
RDW: 13.6 % (ref 11.5–15.5)
WBC: 2.9 10*3/uL — ABNORMAL LOW (ref 4.0–10.5)
nRBC: 0 % (ref 0.0–0.2)

## 2018-02-01 LAB — COMPREHENSIVE METABOLIC PANEL
ALK PHOS: 66 U/L (ref 38–126)
ALT: 17 U/L (ref 0–44)
AST: 86 U/L — ABNORMAL HIGH (ref 15–41)
Albumin: 4.2 g/dL (ref 3.5–5.0)
Anion gap: 17 — ABNORMAL HIGH (ref 5–15)
BUN: 5 mg/dL — ABNORMAL LOW (ref 6–20)
CO2: 29 mmol/L (ref 22–32)
Calcium: 9.3 mg/dL (ref 8.9–10.3)
Chloride: 94 mmol/L — ABNORMAL LOW (ref 98–111)
Creatinine, Ser: 0.73 mg/dL (ref 0.61–1.24)
GFR calc Af Amer: >60 mL/min (ref >60)
GFR calc non Af Amer: >60 mL/min (ref >60)
Glucose, Bld: 91 mg/dL (ref 70–99)
Potassium: 2.5 mmol/L — CL (ref 3.5–5.1)
SODIUM: 140 mmol/L (ref 135–145)
Total Bilirubin: 1 mg/dL (ref 0.3–1.2)
Total Protein: 7.6 g/dL (ref 6.5–8.1)

## 2018-02-01 LAB — VITAMIN B12: Vitamin B-12: 141 pg/mL — ABNORMAL LOW (ref 180–914)

## 2018-02-01 LAB — MAGNESIUM: Magnesium: 1.5 mg/dL — ABNORMAL LOW (ref 1.7–2.4)

## 2018-02-01 LAB — FOLATE: Folate: 7.4 ng/mL (ref >5.9)

## 2018-02-01 MED ORDER — PREGABALIN 25 MG PO CAPS
50.0000 mg | ORAL_CAPSULE | Freq: Once | ORAL | Status: AC
  Administered 2018-02-01: 50 mg via ORAL
  Filled 2018-02-01: qty 2

## 2018-02-01 MED ORDER — FOLIC ACID 1 MG PO TABS: 1.0000 mg | ORAL_TABLET | Freq: Every day | ORAL | 0 refills | Status: DC

## 2018-02-01 MED ORDER — LACTATED RINGERS IV BOLUS
1000.0000 mL | Freq: Once | INTRAVENOUS | Status: AC
  Administered 2018-02-01: 1000 mL via INTRAVENOUS

## 2018-02-01 MED ORDER — POTASSIUM CHLORIDE CRYS ER 20 MEQ PO TBCR: 20.0000 meq | EXTENDED_RELEASE_TABLET | Freq: Every day | ORAL | 0 refills | Status: DC

## 2018-02-01 MED ORDER — VITAMIN B-12 100 MCG PO TABS
100.0000 ug | ORAL_TABLET | Freq: Every day | ORAL | Status: DC
  Administered 2018-02-01: 100 ug via ORAL
  Filled 2018-02-01 (×2): qty 1

## 2018-02-01 MED ORDER — FOLIC ACID 1 MG PO TABS
1.0000 mg | ORAL_TABLET | Freq: Once | ORAL | Status: AC
  Administered 2018-02-01: 1 mg via ORAL
  Filled 2018-02-01: qty 1

## 2018-02-01 MED ORDER — CYANOCOBALAMIN 500 MCG PO TABS: 500.0000 ug | ORAL_TABLET | Freq: Every day | ORAL | 0 refills | Status: DC

## 2018-02-01 MED ORDER — PREGABALIN 25 MG PO CAPS: 25.0000 mg | ORAL_CAPSULE | Freq: Two times a day (BID) | ORAL | 0 refills | Status: DC

## 2018-02-01 MED ORDER — MAGNESIUM OXIDE 400 (241.3 MG) MG PO TABS: 400.0000 mg | ORAL_TABLET | Freq: Every day | ORAL | 0 refills | Status: AC

## 2018-02-01 MED ORDER — POTASSIUM CHLORIDE 10 MEQ/100ML IV SOLN
10.0000 meq | INTRAVENOUS | Status: AC
  Administered 2018-02-01 (×2): 10 meq via INTRAVENOUS
  Filled 2018-02-01 (×2): qty 100

## 2018-02-01 MED ORDER — MAGNESIUM SULFATE 2 GM/50ML IV SOLN
2.0000 g | Freq: Once | INTRAVENOUS | Status: AC
  Administered 2018-02-01: 2 g via INTRAVENOUS
  Filled 2018-02-01: qty 50

## 2018-02-01 MED ORDER — POTASSIUM CHLORIDE CRYS ER 20 MEQ PO TBCR
80.0000 meq | EXTENDED_RELEASE_TABLET | Freq: Once | ORAL | Status: AC
  Administered 2018-02-01: 80 meq via ORAL
  Filled 2018-02-01: qty 4

## 2018-02-01 NOTE — ED Notes (Signed)
Patient stepped outside to get car keys from family member.

## 2018-02-01 NOTE — ED Notes (Signed)
ED Provider at bedside. 
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
## 2018-02-01 NOTE — ED Notes (Signed)
Patient verbalizes understanding of discharge instructions. Opportunity for questioning and answers were provided. Armband removed by staff, pt discharged from ED. Pt wheeled to lobby. 

## 2018-02-01 NOTE — ED Provider Notes (Signed)
I received this patient in signout from Dr. Clayborne DanaMesner.  We were awaiting completion of supplements including potassium and magnesium and repeat of EKG. All meds given and repeat EKG shows improved QT interval, no longer w/ QT prolongation. Mild tachycardia which I suspect is likely 2/2 mild alcohol withdrawal given length of stay in the ED. Social work saw patient to assist. Discussed treatment at home w/ prescribed supplements and reviewed return precautions. No signs of severe withdrawal or DTs at time of discharge.   Lawrence Evans, Lawrence Finlandachel Morgan, MD 02/01/18 [phone]

## 2018-02-01 NOTE — Progress Notes (Signed)
Kyrel Leighton J. Lucretia RoersWood, RN, BSN, [place] [phone]  [facility] Messingoger Gomez[facility]/28/18 @ 3:00.  Spoke with pt at bedside and advised to please arrive 15 min early and take a picture ID and your current medications.  Pt verbalizes understanding of keeping appointment.

## 2018-02-15 ENCOUNTER — Other Ambulatory Visit: Payer: Self-pay

## 2018-02-15 ENCOUNTER — Emergency Department (HOSPITAL_COMMUNITY)
Admission: EM | Admit: 2018-02-15 | Discharge: 2018-02-15 | Discharge status: Against Medical Advice | Payer: Self-pay | Attending: Emergency Medicine | Admitting: Emergency Medicine

## 2018-02-15 ENCOUNTER — Encounter (HOSPITAL_COMMUNITY): Payer: Self-pay | Admitting: Emergency Medicine

## 2018-02-15 DIAGNOSIS — Z5321 Procedure and treatment not carried out due to patient leaving prior to being seen by health care provider: Secondary | ICD-10-CM | POA: Insufficient documentation

## 2018-02-15 DIAGNOSIS — M79671 Pain in right foot: Secondary | ICD-10-CM | POA: Insufficient documentation

## 2018-02-15 DIAGNOSIS — M79672 Pain in left foot: Secondary | ICD-10-CM | POA: Insufficient documentation

## 2018-02-15 NOTE — ED Notes (Signed)
Called for Pt No answer 

## 2018-02-15 NOTE — ED Triage Notes (Signed)
Reports bilateral foot pain and burning sensation. Pt seen 12/16 and given prescriptions but states they haven't worked consistently

## 2018-02-15 NOTE — ED Notes (Signed)
Called for Pt to RM. No answer

## 2018-02-22 ENCOUNTER — Encounter (HOSPITAL_COMMUNITY): Payer: Self-pay

## 2018-02-22 ENCOUNTER — Emergency Department (HOSPITAL_COMMUNITY)
Admission: EM | Admit: 2018-02-22 | Discharge: 2018-02-22 | Disposition: A | Discharge status: Home / Self Care | Payer: Self-pay | Attending: Emergency Medicine | Admitting: Emergency Medicine

## 2018-02-22 DIAGNOSIS — Z5321 Procedure and treatment not carried out due to patient leaving prior to being seen by health care provider: Secondary | ICD-10-CM | POA: Insufficient documentation

## 2018-02-22 DIAGNOSIS — G589 Mononeuropathy, unspecified: Secondary | ICD-10-CM | POA: Insufficient documentation

## 2018-02-22 HISTORY — DX: Polyneuropathy, unspecified: G62.9

## 2018-02-22 NOTE — ED Triage Notes (Signed)
Pt complains of bilateral feet pain from neuropathy, pt is not diabetic, he also states that he had a fever at home but currently does not here

## 2018-02-28 ENCOUNTER — Inpatient Hospital Stay (INDEPENDENT_AMBULATORY_CARE_PROVIDER_SITE_OTHER): Payer: Self-pay | Admitting: Internal Medicine

## 2018-04-18 ENCOUNTER — Other Ambulatory Visit: Payer: Self-pay

## 2018-04-18 ENCOUNTER — Encounter (HOSPITAL_COMMUNITY): Payer: Self-pay | Admitting: Emergency Medicine

## 2018-04-18 ENCOUNTER — Emergency Department (HOSPITAL_COMMUNITY)
Admission: EM | Admit: 2018-04-18 | Discharge: 2018-04-18 | Disposition: A | Discharge status: Home / Self Care | Payer: Self-pay | Attending: Emergency Medicine | Admitting: Emergency Medicine

## 2018-04-18 DIAGNOSIS — Z5321 Procedure and treatment not carried out due to patient leaving prior to being seen by health care provider: Secondary | ICD-10-CM | POA: Insufficient documentation

## 2018-04-18 DIAGNOSIS — R55 Syncope and collapse: Secondary | ICD-10-CM | POA: Insufficient documentation

## 2018-04-18 NOTE — ED Triage Notes (Signed)
Pt had 3 episodes of passing out and shaking today. Pt denies hx of seizures. Pt refused to be taken to be seen with EMS when episodes happened earlier. Pt reports that he drinks ETOH everyday but hasnt drank since sometime this past weekend.

## 2018-04-18 NOTE — ED Notes (Signed)
Patient called 3 times for blood draw, no response

## 2018-04-19 ENCOUNTER — Inpatient Hospital Stay (HOSPITAL_COMMUNITY)
Admission: EM | Admit: 2018-04-19 | Discharge: 2018-04-22 | DRG: 101 | Disposition: A | Discharge status: Home / Self Care | Payer: Self-pay | Attending: Internal Medicine | Admitting: Internal Medicine

## 2018-04-19 ENCOUNTER — Encounter (HOSPITAL_COMMUNITY): Payer: Self-pay | Admitting: *Deleted

## 2018-04-19 ENCOUNTER — Other Ambulatory Visit: Payer: Self-pay

## 2018-04-19 ENCOUNTER — Emergency Department (HOSPITAL_COMMUNITY): Payer: Self-pay

## 2018-04-19 DIAGNOSIS — F17210 Nicotine dependence, cigarettes, uncomplicated: Secondary | ICD-10-CM | POA: Diagnosis present

## 2018-04-19 DIAGNOSIS — R569 Unspecified convulsions: Secondary | ICD-10-CM

## 2018-04-19 DIAGNOSIS — F10229 Alcohol dependence with intoxication, unspecified: Secondary | ICD-10-CM | POA: Diagnosis present

## 2018-04-19 DIAGNOSIS — F1010 Alcohol abuse, uncomplicated: Secondary | ICD-10-CM | POA: Diagnosis present

## 2018-04-19 DIAGNOSIS — R531 Weakness: Secondary | ICD-10-CM | POA: Diagnosis present

## 2018-04-19 DIAGNOSIS — E876 Hypokalemia: Secondary | ICD-10-CM | POA: Diagnosis present

## 2018-04-19 DIAGNOSIS — G40509 Epileptic seizures related to external causes, not intractable, without status epilepticus: Principal | ICD-10-CM | POA: Diagnosis present

## 2018-04-19 DIAGNOSIS — M795 Residual foreign body in soft tissue: Secondary | ICD-10-CM | POA: Diagnosis present

## 2018-04-19 DIAGNOSIS — D61818 Other pancytopenia: Secondary | ICD-10-CM | POA: Diagnosis present

## 2018-04-19 DIAGNOSIS — H5462 Unqualified visual loss, left eye, normal vision right eye: Secondary | ICD-10-CM | POA: Diagnosis present

## 2018-04-19 DIAGNOSIS — Y908 Blood alcohol level of 240 mg/100 ml or more: Secondary | ICD-10-CM | POA: Diagnosis present

## 2018-04-19 DIAGNOSIS — E860 Dehydration: Secondary | ICD-10-CM | POA: Diagnosis present

## 2018-04-19 DIAGNOSIS — R000 Tachycardia, unspecified: Secondary | ICD-10-CM | POA: Diagnosis present

## 2018-04-19 DIAGNOSIS — G40909 Epilepsy, unspecified, not intractable, without status epilepticus: Secondary | ICD-10-CM

## 2018-04-19 DIAGNOSIS — G629 Polyneuropathy, unspecified: Secondary | ICD-10-CM | POA: Diagnosis present

## 2018-04-19 DIAGNOSIS — Z79899 Other long term (current) drug therapy: Secondary | ICD-10-CM

## 2018-04-19 DIAGNOSIS — F10239 Alcohol dependence with withdrawal, unspecified: Secondary | ICD-10-CM | POA: Diagnosis not present

## 2018-04-19 LAB — COMPREHENSIVE METABOLIC PANEL
ALT: 41 U/L (ref 0–44)
ANION GAP: 19 — AB (ref 5–15)
AST: 242 U/L — ABNORMAL HIGH (ref 15–41)
Albumin: 4.3 g/dL (ref 3.5–5.0)
Alkaline Phosphatase: 123 U/L (ref 38–126)
BUN: 9 mg/dL (ref 6–20)
CALCIUM: 8.8 mg/dL — AB (ref 8.9–10.3)
CO2: 25 mmol/L (ref 22–32)
Chloride: 97 mmol/L — ABNORMAL LOW (ref 98–111)
Creatinine, Ser: 0.77 mg/dL (ref 0.61–1.24)
GFR calc Af Amer: >60 mL/min (ref >60)
GFR calc non Af Amer: >60 mL/min (ref >60)
Glucose, Bld: 85 mg/dL (ref 70–99)
Potassium: 2.7 mmol/L — CL (ref 3.5–5.1)
Sodium: 141 mmol/L (ref 135–145)
Total Bilirubin: 1.3 mg/dL — ABNORMAL HIGH (ref 0.3–1.2)
Total Protein: 8.2 g/dL — ABNORMAL HIGH (ref 6.5–8.1)

## 2018-04-19 LAB — CBC
HCT: 38.9 % — ABNORMAL LOW (ref 39.0–52.0)
Hemoglobin: 12.9 g/dL — ABNORMAL LOW (ref 13.0–17.0)
MCH: 36.3 pg — ABNORMAL HIGH (ref 26.0–34.0)
MCHC: 33.2 g/dL (ref 30.0–36.0)
MCV: 109.6 fL — ABNORMAL HIGH (ref 80.0–100.0)
Platelets: 144 10*3/uL — ABNORMAL LOW (ref 150–400)
RBC: 3.55 MIL/uL — ABNORMAL LOW (ref 4.22–5.81)
RDW: 15.6 % — AB (ref 11.5–15.5)
WBC: 2.8 10*3/uL — ABNORMAL LOW (ref 4.0–10.5)
nRBC: 0 % (ref 0.0–0.2)

## 2018-04-19 LAB — ETHANOL: Alcohol, Ethyl (B): 306 mg/dL (ref <10)

## 2018-04-19 LAB — CBG MONITORING, ED: Glucose-Capillary: 62 mg/dL — ABNORMAL LOW (ref 70–99)

## 2018-04-19 MED ORDER — ONDANSETRON HCL 4 MG/2ML IJ SOLN
4.0000 mg | Freq: Once | INTRAMUSCULAR | Status: AC
  Administered 2018-04-19: 4 mg via INTRAVENOUS
  Filled 2018-04-19: qty 2

## 2018-04-19 MED ORDER — POTASSIUM CHLORIDE CRYS ER 20 MEQ PO TBCR
40.0000 meq | EXTENDED_RELEASE_TABLET | Freq: Once | ORAL | Status: AC
  Administered 2018-04-19: 40 meq via ORAL
  Filled 2018-04-19: qty 2

## 2018-04-19 MED ORDER — LORAZEPAM 2 MG/ML IJ SOLN
2.0000 mg | Freq: Once | INTRAMUSCULAR | Status: DC
  Filled 2018-04-19: qty 1

## 2018-04-19 MED ORDER — POTASSIUM CHLORIDE 10 MEQ/100ML IV SOLN
10.0000 meq | INTRAVENOUS | Status: AC
  Administered 2018-04-19 – 2018-04-20 (×4): 10 meq via INTRAVENOUS
  Filled 2018-04-19 (×4): qty 100

## 2018-04-19 MED ORDER — LEVETIRACETAM IN NACL 1000 MG/100ML IV SOLN
1000.0000 mg | Freq: Once | INTRAVENOUS | Status: AC
  Administered 2018-04-19: 1000 mg via INTRAVENOUS
  Filled 2018-04-19: qty 100

## 2018-04-19 MED ORDER — SODIUM CHLORIDE 0.9 % IV BOLUS
1000.0000 mL | Freq: Once | INTRAVENOUS | Status: AC
  Administered 2018-04-19: 1000 mL via INTRAVENOUS

## 2018-04-19 NOTE — ED Notes (Addendum)
Date and time results received: 04/19/18 2145 (use smartphrase ".now" to insert current time)  Test: K+ Critical Value: 2.7  Name of Provider Notified: schlossman md  Orders Received? Or Actions Taken?: waiting on orders.

## 2018-04-19 NOTE — ED Notes (Signed)
Patient transported to CT 

## 2018-04-19 NOTE — ED Triage Notes (Addendum)
Pt arrives with his spouse. Spouse reporting that the patient had shaking episode yesterday, "lasting about a minute" she called EMS, pt refused to be transported, brought him here and he refused to stay. She says she drove him here for evaluation tonight and en route he had a similar episode. Pt drinks 1/5 liquor everyday, has had a drink today. During triage, the pt had about a 20 second period that he was staring at his wife and would not answer questions, immediately afterwards, the patient was able to fully answer all of my questions with  clear speech, followed commands without difficulty. Denies history of seizures. Glucose 62 in triage.
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
## 2018-04-19 NOTE — ED Notes (Addendum)
Date and time results received: 04/19/18 2145 (use smartphrase ".now" to insert current time)  Test: ETOH Critical Value: 306  Name of Provider Notified: schlossman md  Orders Received? Or Actions Taken?: waiting on orders.

## 2018-04-20 ENCOUNTER — Encounter (HOSPITAL_COMMUNITY): Payer: Self-pay

## 2018-04-20 ENCOUNTER — Inpatient Hospital Stay (HOSPITAL_COMMUNITY)
Admit: 2018-04-20 | Discharge: 2018-04-20 | Disposition: A | Discharge status: Home / Self Care | Payer: Self-pay | Attending: Student in an Organized Health Care Education/Training Program | Admitting: Student in an Organized Health Care Education/Training Program

## 2018-04-20 DIAGNOSIS — G40909 Epilepsy, unspecified, not intractable, without status epilepticus: Secondary | ICD-10-CM

## 2018-04-20 LAB — COMPREHENSIVE METABOLIC PANEL
ALT: 36 U/L (ref 0–44)
AST: 300 U/L — ABNORMAL HIGH (ref 15–41)
Albumin: 3.4 g/dL — ABNORMAL LOW (ref 3.5–5.0)
Alkaline Phosphatase: 94 U/L (ref 38–126)
Anion gap: 9 (ref 5–15)
BUN: 6 mg/dL (ref 6–20)
CO2: 25 mmol/L (ref 22–32)
Calcium: 7.7 mg/dL — ABNORMAL LOW (ref 8.9–10.3)
Chloride: 105 mmol/L (ref 98–111)
Creatinine, Ser: 0.69 mg/dL (ref 0.61–1.24)
GFR calc Af Amer: >60 mL/min (ref >60)
GFR calc non Af Amer: >60 mL/min (ref >60)
Glucose, Bld: 111 mg/dL — ABNORMAL HIGH (ref 70–99)
POTASSIUM: 3.3 mmol/L — AB (ref 3.5–5.1)
Sodium: 139 mmol/L (ref 135–145)
Total Bilirubin: 1 mg/dL (ref 0.3–1.2)
Total Protein: 6.2 g/dL — ABNORMAL LOW (ref 6.5–8.1)

## 2018-04-20 LAB — RAPID URINE DRUG SCREEN, HOSP PERFORMED
AMPHETAMINES: NOT DETECTED
Barbiturates: NOT DETECTED
Benzodiazepines: NOT DETECTED
Cocaine: NOT DETECTED
Opiates: NOT DETECTED
Tetrahydrocannabinol: NOT DETECTED

## 2018-04-20 LAB — CBC
HCT: 31 % — ABNORMAL LOW (ref 39.0–52.0)
Hemoglobin: 10.2 g/dL — ABNORMAL LOW (ref 13.0–17.0)
MCH: 36.7 pg — ABNORMAL HIGH (ref 26.0–34.0)
MCHC: 32.9 g/dL (ref 30.0–36.0)
MCV: 111.5 fL — ABNORMAL HIGH (ref 80.0–100.0)
Platelets: 128 10*3/uL — ABNORMAL LOW (ref 150–400)
RBC: 2.78 MIL/uL — ABNORMAL LOW (ref 4.22–5.81)
RDW: 15.8 % — ABNORMAL HIGH (ref 11.5–15.5)
WBC: 2.4 10*3/uL — ABNORMAL LOW (ref 4.0–10.5)
nRBC: 0 % (ref 0.0–0.2)

## 2018-04-20 LAB — TSH: TSH: 1.696 u[IU]/mL (ref 0.350–4.500)

## 2018-04-20 LAB — HIV ANTIBODY (ROUTINE TESTING W REFLEX): HIV Screen 4th Generation wRfx: NONREACTIVE

## 2018-04-20 LAB — MAGNESIUM: Magnesium: 1.6 mg/dL — ABNORMAL LOW (ref 1.7–2.4)

## 2018-04-20 MED ORDER — LORAZEPAM 1 MG PO TABS
1.0000 mg | ORAL_TABLET | Freq: Four times a day (QID) | ORAL | Status: DC | PRN
  Administered 2018-04-20 – 2018-04-21 (×3): 1 mg via ORAL
  Filled 2018-04-20 (×3): qty 1

## 2018-04-20 MED ORDER — ORAL CARE MOUTH RINSE
15.0000 mL | Freq: Two times a day (BID) | OROMUCOSAL | Status: DC
  Administered 2018-04-20 – 2018-04-21 (×4): 15 mL via OROMUCOSAL

## 2018-04-20 MED ORDER — FOLIC ACID 1 MG PO TABS
1.0000 mg | ORAL_TABLET | Freq: Every day | ORAL | Status: DC
  Administered 2018-04-20 – 2018-04-22 (×3): 1 mg via ORAL
  Filled 2018-04-20 (×3): qty 1

## 2018-04-20 MED ORDER — MAGNESIUM SULFATE 4 GM/100ML IV SOLN
4.0000 g | Freq: Once | INTRAVENOUS | Status: AC
  Administered 2018-04-20: 4 g via INTRAVENOUS
  Filled 2018-04-20: qty 100

## 2018-04-20 MED ORDER — LEVETIRACETAM IN NACL 500 MG/100ML IV SOLN
500.0000 mg | Freq: Two times a day (BID) | INTRAVENOUS | Status: DC
  Administered 2018-04-20 (×2): 500 mg via INTRAVENOUS
  Filled 2018-04-20 (×3): qty 100

## 2018-04-20 MED ORDER — THIAMINE HCL 100 MG/ML IJ SOLN
100.0000 mg | Freq: Every day | INTRAMUSCULAR | Status: DC
  Filled 2018-04-20: qty 2

## 2018-04-20 MED ORDER — HEPARIN SODIUM (PORCINE) 5000 UNIT/ML IJ SOLN
5000.0000 [IU] | Freq: Three times a day (TID) | INTRAMUSCULAR | Status: DC
  Administered 2018-04-20 – 2018-04-22 (×7): 5000 [IU] via SUBCUTANEOUS
  Filled 2018-04-20 (×7): qty 1

## 2018-04-20 MED ORDER — ONDANSETRON HCL 4 MG/2ML IJ SOLN
4.0000 mg | Freq: Four times a day (QID) | INTRAMUSCULAR | Status: DC | PRN
  Administered 2018-04-20: 4 mg via INTRAVENOUS
  Filled 2018-04-20: qty 2

## 2018-04-20 MED ORDER — LORAZEPAM 2 MG/ML IJ SOLN: 1.0000 mg | Freq: Four times a day (QID) | INTRAMUSCULAR | Status: DC | PRN

## 2018-04-20 MED ORDER — ONDANSETRON HCL 4 MG PO TABS: 4.0000 mg | ORAL_TABLET | Freq: Four times a day (QID) | ORAL | Status: DC | PRN

## 2018-04-20 MED ORDER — ADULT MULTIVITAMIN W/MINERALS CH
1.0000 | ORAL_TABLET | Freq: Every day | ORAL | Status: DC
  Administered 2018-04-20 – 2018-04-22 (×3): 1 via ORAL
  Filled 2018-04-20 (×3): qty 1

## 2018-04-20 MED ORDER — KCL IN DEXTROSE-NACL 40-5-0.9 MEQ/L-%-% IV SOLN
INTRAVENOUS | Status: DC
  Administered 2018-04-20 – 2018-04-21 (×3): via INTRAVENOUS
  Filled 2018-04-20 (×5): qty 1000

## 2018-04-20 MED ORDER — POTASSIUM CHLORIDE CRYS ER 20 MEQ PO TBCR
40.0000 meq | EXTENDED_RELEASE_TABLET | Freq: Once | ORAL | Status: AC
  Administered 2018-04-20: 40 meq via ORAL
  Filled 2018-04-20: qty 2

## 2018-04-20 MED ORDER — POTASSIUM CHLORIDE CRYS ER 20 MEQ PO TBCR
20.0000 meq | EXTENDED_RELEASE_TABLET | Freq: Every day | ORAL | Status: DC
  Administered 2018-04-20 – 2018-04-22 (×3): 20 meq via ORAL
  Filled 2018-04-20 (×4): qty 1

## 2018-04-20 MED ORDER — MAGNESIUM OXIDE 400 (241.3 MG) MG PO TABS
400.0000 mg | ORAL_TABLET | Freq: Every day | ORAL | Status: DC
  Administered 2018-04-20 – 2018-04-22 (×3): 400 mg via ORAL
  Filled 2018-04-20 (×3): qty 1

## 2018-04-20 MED ORDER — ENSURE ENLIVE PO LIQD
237.0000 mL | Freq: Two times a day (BID) | ORAL | Status: DC
  Administered 2018-04-20 – 2018-04-21 (×2): 237 mL via ORAL

## 2018-04-20 MED ORDER — DIPHENHYDRAMINE HCL 25 MG PO CAPS
25.0000 mg | ORAL_CAPSULE | Freq: Every day | ORAL | Status: DC
  Administered 2018-04-20 – 2018-04-22 (×3): 25 mg via ORAL
  Filled 2018-04-20 (×4): qty 1

## 2018-04-20 MED ORDER — FOLIC ACID 1 MG PO TABS
1.0000 mg | ORAL_TABLET | Freq: Every day | ORAL | Status: DC
  Filled 2018-04-20: qty 1

## 2018-04-20 MED ORDER — ACETAMINOPHEN 500 MG PO TABS: 1000.0000 mg | ORAL_TABLET | Freq: Four times a day (QID) | ORAL | Status: DC | PRN

## 2018-04-20 MED ORDER — VITAMIN B-12 1000 MCG PO TABS
500.0000 ug | ORAL_TABLET | Freq: Every day | ORAL | Status: DC
  Administered 2018-04-20 – 2018-04-22 (×3): 500 ug via ORAL
  Filled 2018-04-20 (×3): qty 1

## 2018-04-20 MED ORDER — PREGABALIN 25 MG PO CAPS
25.0000 mg | ORAL_CAPSULE | Freq: Two times a day (BID) | ORAL | Status: DC
  Administered 2018-04-20 – 2018-04-22 (×6): 25 mg via ORAL
  Filled 2018-04-20 (×6): qty 1

## 2018-04-20 MED ORDER — THIAMINE HCL 100 MG/ML IJ SOLN
Freq: Once | INTRAVENOUS | Status: AC
  Administered 2018-04-20: 02:00:00 via INTRAVENOUS
  Filled 2018-04-20: qty 1000

## 2018-04-20 MED ORDER — VITAMIN B-1 100 MG PO TABS
100.0000 mg | ORAL_TABLET | Freq: Every day | ORAL | Status: DC
  Administered 2018-04-20 – 2018-04-22 (×3): 100 mg via ORAL
  Filled 2018-04-20 (×3): qty 1

## 2018-04-20 NOTE — Progress Notes (Signed)
EEG completed, results pending. 

## 2018-04-20 NOTE — Progress Notes (Signed)
No pads seizure avaiable, used blankets.
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
## 2018-04-20 NOTE — ED Notes (Signed)
Urine culture sent with specimen 

## 2018-04-20 NOTE — Progress Notes (Signed)
New Admission Note:    Arrival Method: Bed Mental Orientation:  A & O X 4, irritable Telemetry: box 52 Assessment:  complete Skin: dry,  old scars legs Iv: L AC Pain: none Tubes: n/a Safety Measures: Safety Fall Prevention Plan has been given, discussed and signed. Seizure precautions. Admission: Completed [facility] Orientation: Patient has been orientated to the room, unit, and staff.  Family: Wife at bedside  Orders have been reviewed and implemented. Will continue to monitor the patient. All concerns addressed.  Call light has been placed within reach and bed alarm has been activated.   Allyn Kenner, RN Phone number: [phone]
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
## 2018-04-20 NOTE — Procedures (Signed)
  [facility] [place] [facility] A. Gerilyn Pilgrim, MD     [email or web address]           HISTORY: The patient is a 49 year old who presents with altered mental status and seizures  MEDICATIONS: No current facility-administered medications for this encounter.  No current outpatient medications on file.  Facility-Administered Medications Ordered in Other Encounters:  .  acetaminophen (TYLENOL) tablet 1,000 mg, 1,000 mg, Oral, Q6H PRN, Garba, Mohammad L, MD .  dextrose 5 % and 0.9 % NaCl with KCl 40 mEq/L infusion, , Intravenous, Continuous, Garba, Mohammad L, MD, Last Rate: 100 mL/hr at 04/20/18 1027 .  diphenhydrAMINE (BENADRYL) capsule 25 mg, 25 mg, Oral, Daily, Mikeal Hawthorne, Mohammad L, MD, 25 mg at 04/20/18 1017 .  feeding supplement (ENSURE ENLIVE) (ENSURE ENLIVE) liquid 237 mL, 237 mL, Oral, BID BM, Hall, Carole N, DO, 237 mL at 04/20/18 1323 .  folic acid (FOLVITE) tablet 1 mg, 1 mg, Oral, Daily, Garba, Mohammad L, MD, 1 mg at 04/20/18 1018 .  heparin injection 5,000 Units, 5,000 Units, Subcutaneous, Q8H, Rometta Emery, MD, 5,000 Units at 04/20/18 1319 .  levETIRAcetam (KEPPRA) IVPB 500 mg/100 mL premix, 500 mg, Intravenous, Q12H, Garba, Mohammad L, MD, Last Rate: 400 mL/hr at 04/20/18 1032, 500 mg at 04/20/18 1032 .  LORazepam (ATIVAN) tablet 1 mg, 1 mg, Oral, Q6H PRN, 1 mg at 04/20/18 1318 **OR** LORazepam (ATIVAN) injection 1 mg, 1 mg, Intravenous, Q6H PRN, Mikeal Hawthorne, Mohammad L, MD .  LORazepam (ATIVAN) injection 2 mg, 2 mg, Intravenous, Once, Rometta Emery, MD, Stopped at 04/19/18 2104 .  magnesium oxide (MAG-OX) tablet 400 mg, 400 mg, Oral, Daily, Mikeal Hawthorne, Mohammad L, MD, 400 mg at 04/20/18 1017 .  MEDLINE mouth rinse, 15 mL, Mouth Rinse, BID, Garba, Mohammad L, MD, 15 mL at 04/20/18 1034 .  multivitamin with minerals tablet 1 tablet, 1 tablet, Oral, Daily, Earlie Lou L, MD, 1 tablet at 04/20/18 1017 .  ondansetron (ZOFRAN) tablet 4 mg, 4 mg, Oral, Q6H PRN **OR** ondansetron (ZOFRAN)  injection 4 mg, 4 mg, Intravenous, Q6H PRN, Rometta Emery, MD, 4 mg at 04/20/18 0857 .  potassium chloride SA (K-DUR,KLOR-CON) CR tablet 20 mEq, 20 mEq, Oral, Daily, Mikeal Hawthorne, Mohammad L, MD, 20 mEq at 04/20/18 1318 .  pregabalin (LYRICA) capsule 25 mg, 25 mg, Oral, BID, Mikeal Hawthorne, Mohammad L, MD, 25 mg at 04/20/18 1017 .  thiamine (VITAMIN B-1) tablet 100 mg, 100 mg, Oral, Daily, 100 mg at 04/20/18 1017 **OR** thiamine (B-1) injection 100 mg, 100 mg, Intravenous, Daily, Garba, Mohammad L, MD .  vitamin B-12 (CYANOCOBALAMIN) tablet 500 mcg, 500 mcg, Oral, Daily, Garba, Mohammad L, MD, 500 mcg at 04/20/18 1018     ANALYSIS: A 16 channel recording using standard 10 20 measurements is conducted for 23 minutes.  There is a low voltage high frequency background activity of 15 Hz.  There is beta activity observed in frontal areas.  The recording is somewhat degraded by myogenic and movement artifact.  Photic stimulation and hyperventilation are not conducted.  There is no focal or lateralized slowing.  There is no epileptiform activity is observed.   IMPRESSION: 1.  This is a normal recording of the awake and drowsy states.      Bram Hottel A. Gerilyn Pilgrim, M.D.  Diplomate, Biomedical engineer of Psychiatry and Neurology ( Neurology).
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
## 2018-04-21 LAB — CBC
HCT: 33.8 % — ABNORMAL LOW (ref 39.0–52.0)
Hemoglobin: 10.5 g/dL — ABNORMAL LOW (ref 13.0–17.0)
MCH: 35.2 pg — AB (ref 26.0–34.0)
MCHC: 31.1 g/dL (ref 30.0–36.0)
MCV: 113.4 fL — ABNORMAL HIGH (ref 80.0–100.0)
Platelets: 120 10*3/uL — ABNORMAL LOW (ref 150–400)
RBC: 2.98 MIL/uL — ABNORMAL LOW (ref 4.22–5.81)
RDW: 15.1 % (ref 11.5–15.5)
WBC: 2 10*3/uL — ABNORMAL LOW (ref 4.0–10.5)
nRBC: 1.5 % — ABNORMAL HIGH (ref 0.0–0.2)

## 2018-04-21 LAB — BASIC METABOLIC PANEL
Anion gap: 5 (ref 5–15)
BUN: <5 mg/dL — ABNORMAL LOW (ref 6–20)
CO2: 25 mmol/L (ref 22–32)
Calcium: 8.3 mg/dL — ABNORMAL LOW (ref 8.9–10.3)
Chloride: 107 mmol/L (ref 98–111)
Creatinine, Ser: 0.64 mg/dL (ref 0.61–1.24)
GFR calc Af Amer: >60 mL/min (ref >60)
GFR calc non Af Amer: >60 mL/min (ref >60)
Glucose, Bld: 123 mg/dL — ABNORMAL HIGH (ref 70–99)
Potassium: 3.9 mmol/L (ref 3.5–5.1)
Sodium: 137 mmol/L (ref 135–145)

## 2018-04-21 MED ORDER — LORAZEPAM 2 MG/ML IJ SOLN
1.0000 mg | Freq: Once | INTRAMUSCULAR | Status: AC
  Administered 2018-04-21: 1 mg via INTRAVENOUS
  Filled 2018-04-21: qty 1

## 2018-04-21 MED ORDER — LEVETIRACETAM 500 MG PO TABS
500.0000 mg | ORAL_TABLET | Freq: Two times a day (BID) | ORAL | Status: DC
  Administered 2018-04-21 – 2018-04-22 (×3): 500 mg via ORAL
  Filled 2018-04-21 (×3): qty 1

## 2018-04-21 MED ORDER — SODIUM CHLORIDE 0.9 % IV BOLUS
500.0000 mL | Freq: Once | INTRAVENOUS | Status: AC
  Administered 2018-04-21: 500 mL via INTRAVENOUS

## 2018-04-21 NOTE — Progress Notes (Signed)
Pt with 2 episodes of tachycardia into the 140-150 with mobility. Each time HR returned to the 70s once settled back in bed. Other vitals remain unremarkable. NP on call notified and orders received. Will continue to monitor.

## 2018-04-21 NOTE — Progress Notes (Signed)
Pt with pulse of 144 when up to BR, currently 104 since back to bed. Provider notified, Jonathon RN/RRT notified

## 2018-04-21 NOTE — Progress Notes (Signed)
EEG completed.  Reported late last evening. Normal EEG. No new recommendations Please call neurology with any questions. -- Milon Dikes, MD [facility] Pager: [phone] If 7pm to 7am, please call on call as listed on AMION.
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
## 2018-04-22 MED ORDER — FOLIC ACID 1 MG PO TABS: 1.0000 mg | ORAL_TABLET | Freq: Every day | ORAL | 0 refills | Status: DC

## 2018-04-22 MED ORDER — THIAMINE HCL 100 MG PO TABS: 100.0000 mg | ORAL_TABLET | Freq: Every day | ORAL | 0 refills | Status: DC

## 2018-04-22 MED ORDER — LEVETIRACETAM 500 MG PO TABS: 500.0000 mg | ORAL_TABLET | Freq: Two times a day (BID) | ORAL | 0 refills | Status: DC

## 2018-04-22 MED ORDER — CYANOCOBALAMIN 500 MCG PO TABS: 500.0000 ug | ORAL_TABLET | Freq: Every day | ORAL | 0 refills | Status: DC

## 2018-04-22 MED ORDER — ADULT MULTIVITAMIN W/MINERALS CH: 1.0000 | ORAL_TABLET | Freq: Every day | ORAL | 0 refills | Status: DC
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
## 2018-04-22 NOTE — Progress Notes (Signed)
Patient given discharge instructions, and verbalized an understanding of all discharge instructions.  Patient agrees with discharge plan, and is being discharged in stable medical condition.  Patient given transportation via wheelchair. 

## 2018-04-22 NOTE — Discharge Instructions (Signed)
Seizure, Adult °When you have a seizure: °· Parts of your body may move. °· You may have a change in how aware or awake (conscious) you are. °· You may shake (convulse). °Seizures usually last from 30 seconds to 2 minutes. Usually, they are not harmful unless they last a long time. °What are the signs or symptoms? °Common symptoms of this condition include: °· Shaking (convulsions). °· Stiffness in the body. °· Passing out (losing consciousness). °· Uncontrolled movements in the: °? Arms or legs. °? Eyes. °? Head. °? Mouth. °Some people have symptoms right before a seizure happens. These symptoms may include: °· Fear. °· Worry (anxiety). °· Feeling like you are going to throw up (nausea). °· Feeling like the room is spinning (vertigo). °· Feeling like you saw or heard something before (déjà vu). °· Odd tastes or smells. °· Changes in vision, such as seeing flashing lights or spots. °Follow these instructions at home: °Medicines ° °· Take over-the-counter and prescription medicines only as told by your doctor. °· Do not eat or drink anything that may keep your medicine from working, such as alcohol. °Activity °· Do not do any activities that would be dangerous if you had another seizure, like driving or swimming. Wait until your doctor says it is safe for you to do them. °· If you live in the [place] ask your local DMV (department of motor vehicles) when you can drive. °· Get plenty of rest. °Teaching others ° °· Teach friends and family what to do when you have a seizure. They should: °? Lay you on the ground. °? Protect your head and body. °? Loosen any tight clothing around your neck. °? Turn you on your side. °? Not hold you down. °? Not put anything into your mouth. °? Know whether or not you need emergency care. °? Stay with you until you are better. °General instructions °· Contact your doctor each time you have a seizure. °· Avoid anything that gives you seizures. °· Keep a seizure diary. Write down: °? What  you think caused each seizure. °? What you remember about each seizure. °· Keep all follow-up visits as told by your doctor. This is important. °Contact a doctor if: °· You have another seizure. °· You have seizures more often. °· There is any change in what happens during your seizures. °· You keep having seizures with treatment. °· You have symptoms of being sick or having an infection. °Get help right away if: °· You have a seizure: °? That lasts longer than 5 minutes. °? That is different than seizures you had before. °? That makes it harder to breathe. °? After you hurt your head. °· After a seizure, you cannot speak or use a part of your body. °· After a seizure, you are confused or have a bad headache. °· You have two or more seizures in a row. °· You are having seizures more often. °· You do not wake up right after a seizure. °· You get hurt during a seizure. °In an emergency: °· These symptoms may be an emergency. Do not wait to see if the symptoms will go away. Get medical help right away. Call your local emergency services (911 in the [place] Do not drive yourself to the hospital. °Summary °· Seizures usually last from 30 seconds to 2 minutes. Usually, they are not harmful unless they last a long time. °· Do not eat or drink anything that may keep your medicine from working, such as alcohol. °·   Teach friends and family what to do when you have a seizure. °· Contact your doctor each time you have a seizure. °This information is not intended to replace advice given to you by your health care provider. Make sure you discuss any questions you have with your health care provider. °Document Released: 07/22/2007 Document Revised: 10/27/2017 Document Reviewed: 03/11/2017 °Elsevier Interactive Patient Education © 2019 [employer] Inc[employer] ° °
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
## 2018-04-29 ENCOUNTER — Encounter: Payer: Self-pay | Admitting: Internal Medicine

## 2018-04-29 ENCOUNTER — Ambulatory Visit (INDEPENDENT_AMBULATORY_CARE_PROVIDER_SITE_OTHER): Payer: Self-pay | Admitting: Internal Medicine

## 2018-04-29 ENCOUNTER — Other Ambulatory Visit: Payer: Self-pay

## 2018-04-29 VITALS — BP 130/90 | HR 78 | Resp 12 | Ht 70.5 in | Wt 136.0 lb

## 2018-04-29 DIAGNOSIS — G629 Polyneuropathy, unspecified: Secondary | ICD-10-CM

## 2018-04-29 DIAGNOSIS — D539 Nutritional anemia, unspecified: Secondary | ICD-10-CM

## 2018-04-29 DIAGNOSIS — R739 Hyperglycemia, unspecified: Secondary | ICD-10-CM

## 2018-04-29 DIAGNOSIS — G40909 Epilepsy, unspecified, not intractable, without status epilepticus: Secondary | ICD-10-CM

## 2018-04-29 DIAGNOSIS — F1010 Alcohol abuse, uncomplicated: Secondary | ICD-10-CM

## 2018-04-29 MED ORDER — CYANOCOBALAMIN 500 MCG PO TABS: 500.0000 ug | ORAL_TABLET | Freq: Every day | ORAL | 11 refills | Status: AC

## 2018-04-29 MED ORDER — LEVETIRACETAM 500 MG PO TABS: 500.0000 mg | ORAL_TABLET | Freq: Two times a day (BID) | ORAL | 11 refills | Status: DC

## 2018-04-29 MED ORDER — GABAPENTIN 100 MG PO CAPS: ORAL_CAPSULE | ORAL | 6 refills | Status: AC

## 2018-04-29 MED ORDER — ADULT MULTIVITAMIN W/MINERALS CH: 1.0000 | ORAL_TABLET | Freq: Every day | ORAL | 11 refills | Status: AC

## 2018-04-29 MED ORDER — THIAMINE HCL 100 MG PO TABS: 100.0000 mg | ORAL_TABLET | Freq: Every day | ORAL | 11 refills | Status: AC

## 2018-04-29 MED ORDER — FOLIC ACID 1 MG PO TABS: 1.0000 mg | ORAL_TABLET | Freq: Every day | ORAL | 11 refills | Status: AC
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
## 2018-04-29 NOTE — Progress Notes (Signed)
Social work Barrister's clerk completed new patient screening to assess for mental health symptoms and social determinants of health (SDOH).  Patient reported that he is experiencing insomnia, and has been feeling down because of the pain he is experiencing in his feet. He also stated that he has been moving slow because of the pain, and he has had some thoughts of hurting himself in the past, because of stress due to inability to work and provide for his family, but no intent to commit suicide. Patient denied having any issues with SDOH. [facility] gave him a card, and asked him to reach out if he has any mental health concerns.

## 2018-04-30 LAB — BASIC METABOLIC PANEL
BUN/Creatinine Ratio: 9 (ref 9–20)
BUN: 7 mg/dL (ref 6–24)
CO2: 22 mmol/L (ref 20–29)
CREATININE: 0.8 mg/dL (ref 0.76–1.27)
Calcium: 9.9 mg/dL (ref 8.7–10.2)
Chloride: 103 mmol/L (ref 96–106)
GFR calc Af Amer: 122 mL/min/{1.73_m2} (ref >59)
GFR, EST NON AFRICAN AMERICAN: 106 mL/min/{1.73_m2} (ref >59)
Glucose: 90 mg/dL (ref 65–99)
Potassium: 4.2 mmol/L (ref 3.5–5.2)
Sodium: 143 mmol/L (ref 134–144)

## 2018-04-30 LAB — CBC WITH DIFFERENTIAL/PLATELET
BASOS: 1 %
Basophils Absolute: 0 10*3/uL (ref 0.0–0.2)
EOS (ABSOLUTE): 0 10*3/uL (ref 0.0–0.4)
EOS: 0 %
Hematocrit: 36 % — ABNORMAL LOW (ref 37.5–51.0)
Hemoglobin: 12.6 g/dL — ABNORMAL LOW (ref 13.0–17.7)
Immature Grans (Abs): 0 10*3/uL (ref 0.0–0.1)
Immature Granulocytes: 0 %
Lymphocytes Absolute: 0.6 10*3/uL — ABNORMAL LOW (ref 0.7–3.1)
Lymphs: 18 %
MCH: 36.3 pg — ABNORMAL HIGH (ref 26.6–33.0)
MCHC: 35 g/dL (ref 31.5–35.7)
MCV: 104 fL — ABNORMAL HIGH (ref 79–97)
Monocytes Absolute: 0.4 10*3/uL (ref 0.1–0.9)
Monocytes: 13 %
Neutrophils Absolute: 2.4 10*3/uL (ref 1.4–7.0)
Neutrophils: 68 %
Platelets: 388 10*3/uL (ref 150–450)
RBC: 3.47 x10E6/uL — AB (ref 4.14–5.80)
RDW: 13.8 % (ref 11.6–15.4)
WBC: 3.5 10*3/uL (ref 3.4–10.8)

## 2018-04-30 LAB — HGB A1C W/O EAG: Hgb A1c MFr Bld: 4.2 % — ABNORMAL LOW (ref 4.8–5.6)

## 2018-06-14 ENCOUNTER — Telehealth: Payer: Self-pay | Admitting: Internal Medicine

## 2018-06-14 MED ORDER — LEVETIRACETAM 500 MG PO TABS: 500.0000 mg | ORAL_TABLET | Freq: Two times a day (BID) | ORAL | 11 refills | Status: AC

## 2018-06-14 NOTE — Telephone Encounter (Signed)
Called [facility] pharmacy and spoke with Aggie Cosier. He never went through MAP application for Keppra and now not able to obtain this way. He picked up one Rx for Gabapentin on the 17th of March, but has not come back for more. They do not have any of the vitamins sent to their pharmacy as well.  Called patient back and informed him he can pick up the Gabapentin at [facility], but he never went through the application process for the Keppra and we will have to see if it can be obtained for low cost elsewhere.

## 2018-06-16 NOTE — Telephone Encounter (Signed)
Left detailed message for patient. Rx for generic Keppra is now at [facility] on [address]. Informed he can still get gabapentin from the PHD but Keppra will be coming from [facility] from now on

## 2018-06-19 NOTE — Patient Instructions (Signed)
Gabapentin: Start taking Gabapentin 100 mg cap 1 cap at bedtime. In 3 days, increase to 2 caps at bedtime. In another 3 days, increase to 3 caps at bedtime You should be taking 3 caps at bedtime at this point.  In 3 days, start another 1 cap in the morning In 3 more days, increase to 2 caps in the morning In 3 more days, increase to 3 caps in the morning:  You should be taking 3 caps in the morning and 3 caps at bedtime at this point.  In 3 days, start another dose midday--1 cap In 3 more days, increase to 2 caps midday In 3 more days, increase to 3 caps midday. You should be taking 3 caps 3 times daily at this point.  Stay on this dose until follow up If you do not tolerate increasing the dose at any point, hold on the dose you tolerate or call clinic if having problems 

## 2018-06-28 ENCOUNTER — Ambulatory Visit: Payer: Self-pay | Admitting: Internal Medicine

## 2018-07-29 ENCOUNTER — Ambulatory Visit: Payer: Self-pay | Admitting: Internal Medicine

## 2019-08-30 ENCOUNTER — Ambulatory Visit (HOSPITAL_COMMUNITY)
Admission: EM | Admit: 2019-08-30 | Discharge: 2019-08-30 | Disposition: A | Discharge status: Home / Self Care | Payer: HRSA Program | Attending: Internal Medicine | Admitting: Internal Medicine

## 2019-08-30 ENCOUNTER — Encounter (HOSPITAL_COMMUNITY): Payer: Self-pay | Admitting: Emergency Medicine

## 2019-08-30 ENCOUNTER — Other Ambulatory Visit: Payer: Self-pay

## 2019-08-30 DIAGNOSIS — F17200 Nicotine dependence, unspecified, uncomplicated: Secondary | ICD-10-CM

## 2019-08-30 DIAGNOSIS — H5462 Unqualified visual loss, left eye, normal vision right eye: Secondary | ICD-10-CM | POA: Diagnosis not present

## 2019-08-30 DIAGNOSIS — R112 Nausea with vomiting, unspecified: Secondary | ICD-10-CM | POA: Insufficient documentation

## 2019-08-30 DIAGNOSIS — R05 Cough: Secondary | ICD-10-CM | POA: Insufficient documentation

## 2019-08-30 DIAGNOSIS — R0981 Nasal congestion: Secondary | ICD-10-CM | POA: Insufficient documentation

## 2019-08-30 DIAGNOSIS — R197 Diarrhea, unspecified: Secondary | ICD-10-CM | POA: Insufficient documentation

## 2019-08-30 DIAGNOSIS — Z79899 Other long term (current) drug therapy: Secondary | ICD-10-CM | POA: Diagnosis not present

## 2019-08-30 DIAGNOSIS — R52 Pain, unspecified: Secondary | ICD-10-CM

## 2019-08-30 DIAGNOSIS — F17210 Nicotine dependence, cigarettes, uncomplicated: Secondary | ICD-10-CM | POA: Diagnosis not present

## 2019-08-30 DIAGNOSIS — R059 Cough, unspecified: Secondary | ICD-10-CM

## 2019-08-30 DIAGNOSIS — Z20822 Contact with and (suspected) exposure to covid-19: Secondary | ICD-10-CM | POA: Diagnosis not present

## 2019-08-30 DIAGNOSIS — B349 Viral infection, unspecified: Secondary | ICD-10-CM | POA: Diagnosis not present

## 2019-08-30 DIAGNOSIS — Z87891 Personal history of nicotine dependence: Secondary | ICD-10-CM

## 2019-08-30 MED ORDER — BENZONATATE 100 MG PO CAPS: 100.0000 mg | ORAL_CAPSULE | Freq: Three times a day (TID) | ORAL | 0 refills | Status: DC | PRN

## 2019-08-30 MED ORDER — ONDANSETRON 8 MG PO TBDP
8.0000 mg | ORAL_TABLET | Freq: Three times a day (TID) | ORAL | 0 refills | Status: AC | PRN
Start: 2019-08-30 — End: ?

## 2019-08-30 MED ORDER — PROMETHAZINE-DM 6.25-15 MG/5ML PO SYRP: 5.0000 mL | ORAL_SOLUTION | Freq: Every evening | ORAL | 0 refills | Status: DC | PRN

## 2019-08-30 MED ORDER — PREDNISONE 20 MG PO TABS
ORAL_TABLET | ORAL | 0 refills | Status: DC
Start: 2019-08-30 — End: 2020-10-16

## 2019-08-30 NOTE — ED Triage Notes (Signed)
Pt here for N/V x 2 days with cough and congestion and body aches

## 2019-08-30 NOTE — Discharge Instructions (Addendum)
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
## 2019-08-31 LAB — SARS CORONAVIRUS 2 (TAT 6-24 HRS): SARS Coronavirus 2: NEGATIVE
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
## 2020-10-15 ENCOUNTER — Emergency Department (HOSPITAL_COMMUNITY)
Admission: EM | Admit: 2020-10-15 | Discharge: 2020-10-16 | Disposition: A | Discharge status: Home / Self Care | Payer: Self-pay | Attending: Emergency Medicine | Admitting: Emergency Medicine

## 2020-10-15 ENCOUNTER — Other Ambulatory Visit: Payer: Self-pay

## 2020-10-15 ENCOUNTER — Encounter (HOSPITAL_COMMUNITY): Payer: Self-pay | Admitting: Emergency Medicine

## 2020-10-15 DIAGNOSIS — R112 Nausea with vomiting, unspecified: Secondary | ICD-10-CM | POA: Insufficient documentation

## 2020-10-15 DIAGNOSIS — R109 Unspecified abdominal pain: Secondary | ICD-10-CM | POA: Insufficient documentation

## 2020-10-15 DIAGNOSIS — E876 Hypokalemia: Secondary | ICD-10-CM | POA: Insufficient documentation

## 2020-10-15 DIAGNOSIS — F17210 Nicotine dependence, cigarettes, uncomplicated: Secondary | ICD-10-CM | POA: Insufficient documentation

## 2020-10-15 LAB — URINALYSIS, ROUTINE W REFLEX MICROSCOPIC
Glucose, UA: 100 mg/dL — AB
Ketones, ur: 15 mg/dL — AB
Nitrite: POSITIVE — AB
Protein, ur: 100 mg/dL — AB
Specific Gravity, Urine: 1.025 (ref 1.005–1.030)
pH: 6.5 (ref 5.0–8.0)

## 2020-10-15 LAB — CBC WITH DIFFERENTIAL/PLATELET
Abs Immature Granulocytes: 0 10*3/uL (ref 0.00–0.07)
Basophils Absolute: 0 10*3/uL (ref 0.0–0.1)
Basophils Relative: 0 %
Eosinophils Absolute: 0 10*3/uL (ref 0.0–0.5)
Eosinophils Relative: 1 %
HCT: 42.1 % (ref 39.0–52.0)
Hemoglobin: 14.6 g/dL (ref 13.0–17.0)
Immature Granulocytes: 0 %
Lymphocytes Relative: 36 %
Lymphs Abs: 1 10*3/uL (ref 0.7–4.0)
MCH: 35.3 pg — ABNORMAL HIGH (ref 26.0–34.0)
MCHC: 34.7 g/dL (ref 30.0–36.0)
MCV: 101.7 fL — ABNORMAL HIGH (ref 80.0–100.0)
Monocytes Absolute: 0.3 10*3/uL (ref 0.1–1.0)
Monocytes Relative: 12 %
Neutro Abs: 1.4 10*3/uL — ABNORMAL LOW (ref 1.7–7.7)
Neutrophils Relative %: 51 %
Platelets: 135 10*3/uL — ABNORMAL LOW (ref 150–400)
RBC: 4.14 MIL/uL — ABNORMAL LOW (ref 4.22–5.81)
RDW: 13.2 % (ref 11.5–15.5)
WBC: 2.8 10*3/uL — ABNORMAL LOW (ref 4.0–10.5)
nRBC: 0 % (ref 0.0–0.2)

## 2020-10-15 LAB — COMPREHENSIVE METABOLIC PANEL WITH GFR
ALT: 40 U/L (ref 0–44)
AST: 121 U/L — ABNORMAL HIGH (ref 15–41)
Albumin: 4.1 g/dL (ref 3.5–5.0)
Alkaline Phosphatase: 99 U/L (ref 38–126)
Anion gap: 17 — ABNORMAL HIGH (ref 5–15)
BUN: 13 mg/dL (ref 6–20)
CO2: 33 mmol/L — ABNORMAL HIGH (ref 22–32)
Calcium: 9.6 mg/dL (ref 8.9–10.3)
Chloride: 86 mmol/L — ABNORMAL LOW (ref 98–111)
Creatinine, Ser: 1.05 mg/dL (ref 0.61–1.24)
GFR, Estimated: >60 mL/min
Glucose, Bld: 113 mg/dL — ABNORMAL HIGH (ref 70–99)
Potassium: 2.8 mmol/L — ABNORMAL LOW (ref 3.5–5.1)
Sodium: 136 mmol/L (ref 135–145)
Total Bilirubin: 2.4 mg/dL — ABNORMAL HIGH (ref 0.3–1.2)
Total Protein: 8.5 g/dL — ABNORMAL HIGH (ref 6.5–8.1)

## 2020-10-15 LAB — URINALYSIS, MICROSCOPIC (REFLEX): RBC / HPF: NONE SEEN RBC/hpf (ref 0–5)

## 2020-10-15 LAB — LIPASE, BLOOD: Lipase: 29 U/L (ref 11–51)

## 2020-10-15 NOTE — ED Triage Notes (Signed)
Pt c/o feeling unwell x 1 week, reports nausea/vomiting that started on Thursday. Denies pain.

## 2020-10-15 NOTE — ED Provider Notes (Signed)
Emergency Medicine Provider Triage Evaluation Note  Lawrence Evans , a 50 y.o. male  was evaluated in triage.  Pt complains of feeling unwell for a few weeks. Has not been able to eat for a week and has been vomiting, has not had a bm in a week.  Review of Systems  Positive: Abd pain, nv, constipation Negative: fever  Physical Exam  There were no vitals taken for this visit. Gen:   Awake, no distress   Resp:  Normal effort  MSK:   Moves extremities without difficulty  Other:  Abd is nondistended  Medical Decision Making  Medically screening exam initiated at 5:14 PM.  Appropriate orders placed.  Lawrence Evans was informed that the remainder of the evaluation will be completed by another provider, this initial triage assessment does not replace that evaluation, and the importance of remaining in the ED until their evaluation is complete.     Lawrence Meres, PA-C 10/15/20 1718    Lawrence Plan, DO 10/15/20 2249

## 2020-10-16 ENCOUNTER — Emergency Department (HOSPITAL_COMMUNITY): Payer: Self-pay

## 2020-10-16 LAB — MAGNESIUM: Magnesium: 1.7 mg/dL (ref 1.7–2.4)

## 2020-10-16 MED ORDER — POTASSIUM CHLORIDE CRYS ER 20 MEQ PO TBCR: 20.0000 meq | EXTENDED_RELEASE_TABLET | Freq: Two times a day (BID) | ORAL | 0 refills | Status: AC

## 2020-10-16 MED ORDER — ONDANSETRON HCL 4 MG/2ML IJ SOLN
4.0000 mg | Freq: Once | INTRAMUSCULAR | Status: AC
  Administered 2020-10-16: 4 mg via INTRAVENOUS
  Filled 2020-10-16: qty 2

## 2020-10-16 MED ORDER — IOHEXOL 350 MG/ML SOLN
75.0000 mL | Freq: Once | INTRAVENOUS | Status: AC | PRN
  Administered 2020-10-16: 75 mL via INTRAVENOUS

## 2020-10-16 MED ORDER — POTASSIUM CHLORIDE 10 MEQ/100ML IV SOLN
10.0000 meq | INTRAVENOUS | Status: AC
  Administered 2020-10-16 (×3): 10 meq via INTRAVENOUS
  Filled 2020-10-16 (×3): qty 100

## 2020-10-16 MED ORDER — MORPHINE SULFATE (PF) 4 MG/ML IV SOLN
4.0000 mg | Freq: Once | INTRAVENOUS | Status: AC
  Administered 2020-10-16: 4 mg via INTRAVENOUS
  Filled 2020-10-16: qty 1

## 2020-10-16 MED ORDER — ONDANSETRON HCL 4 MG PO TABS
4.0000 mg | ORAL_TABLET | Freq: Four times a day (QID) | ORAL | 0 refills | Status: AC | PRN
Start: 2020-10-16 — End: ?

## 2020-10-16 MED ORDER — POTASSIUM CHLORIDE CRYS ER 20 MEQ PO TBCR
20.0000 meq | EXTENDED_RELEASE_TABLET | Freq: Two times a day (BID) | ORAL | 0 refills | Status: DC
Start: 2020-10-16 — End: 2020-10-16

## 2020-10-16 NOTE — ED Notes (Signed)
Pt tolerated food and drink well. No complaints of n/v or pain.
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
## 2020-10-17 LAB — URINE CULTURE

## 2022-04-23 IMAGING — CT CT ABD-PELV W/ CM
2 of 5 series · 16 of 46 positions shown, 18 images · IV contrast (APPLIED)
Comparison: None.

CLINICAL DATA: Abdominal pain and fever.

EXAM:
CT ABDOMEN AND PELVIS WITH CONTRAST
TECHNIQUE: Multidetector CT imaging of the abdomen and pelvis was performed
using the standard protocol following bolus administration of
intravenous contrast.
CONTRAST:  75mL OMNIPAQUE IOHEXOL 350 MG/ML SOLN

[Series 3: abdomen 5.0 · axial · 0.59mm/px · z∈[-423,-73]mm · 13 of 80 slices shown, 15 images]
[im 5/80  soft-tissue]
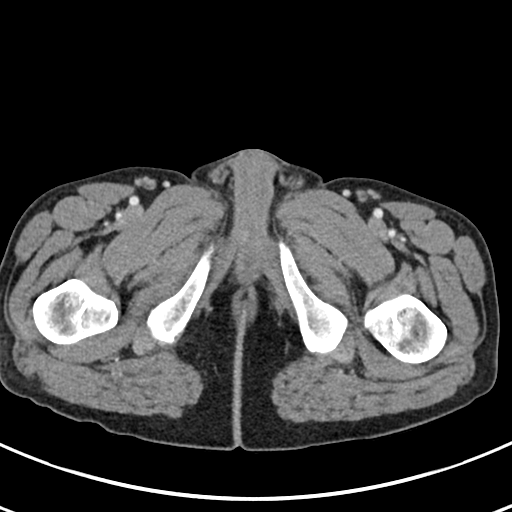
[im 5/80  bone]
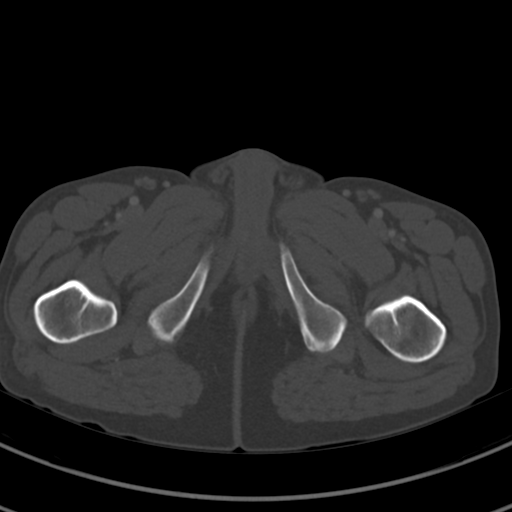
[im 10/80  soft-tissue]
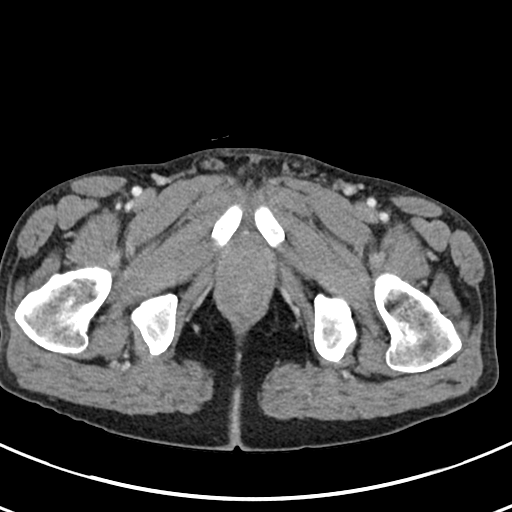
[im 15/80  soft-tissue]
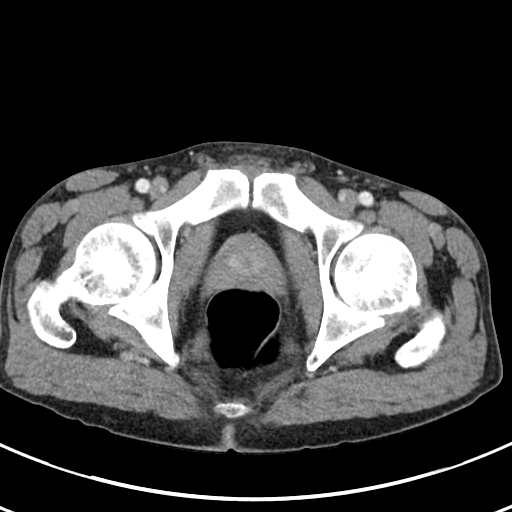
[im 25/80  soft-tissue]
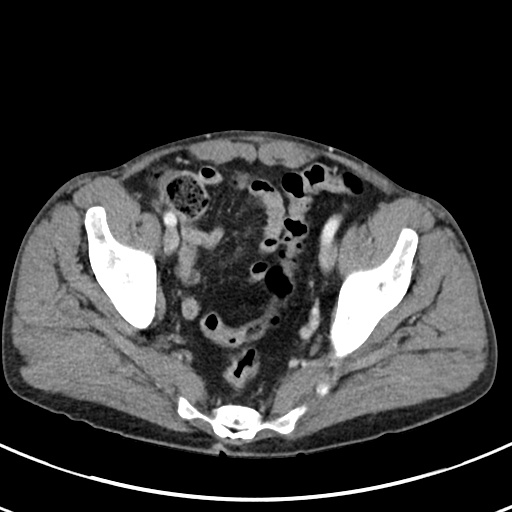
[im 30/80  soft-tissue]
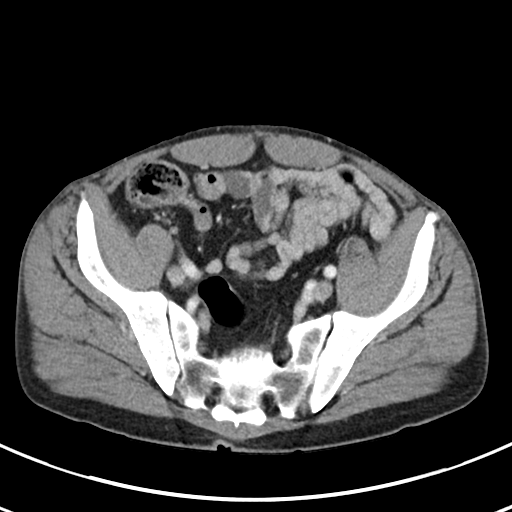
[im 35/80  soft-tissue]
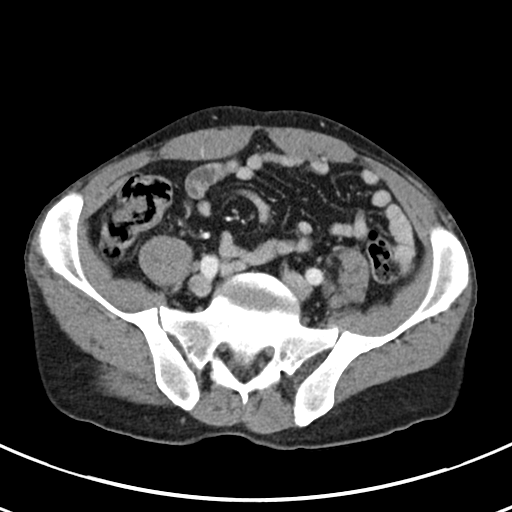
[im 40/80  soft-tissue]
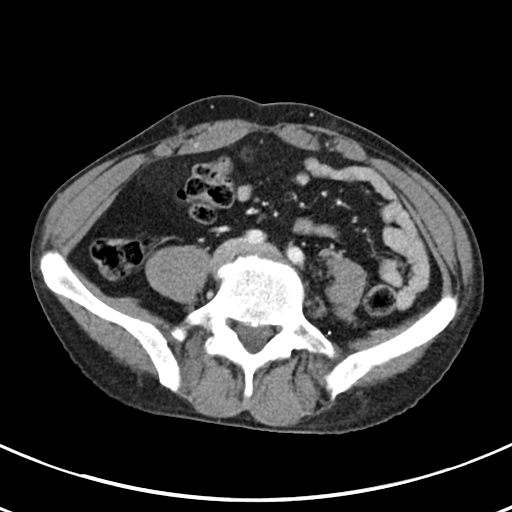
[im 45/80  soft-tissue]
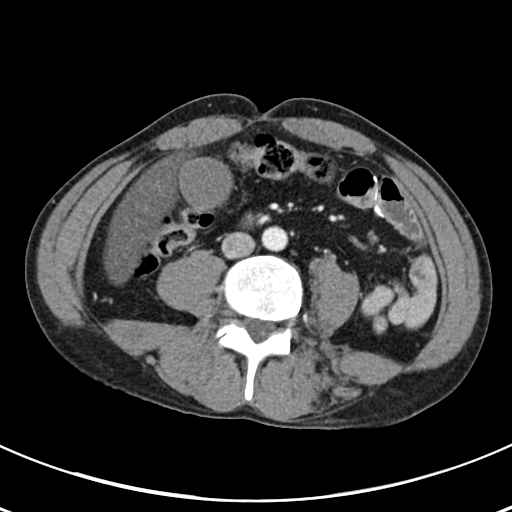
[im 50/80  soft-tissue]
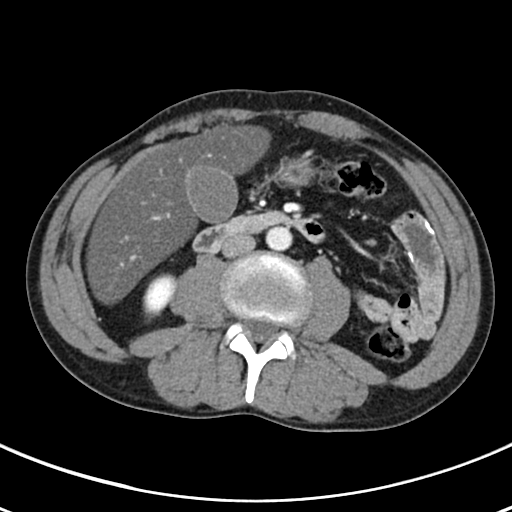
[im 50/80  bone]
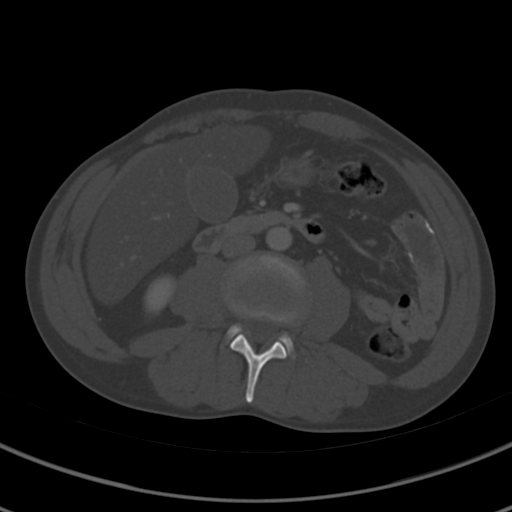
[im 55/80  soft-tissue]
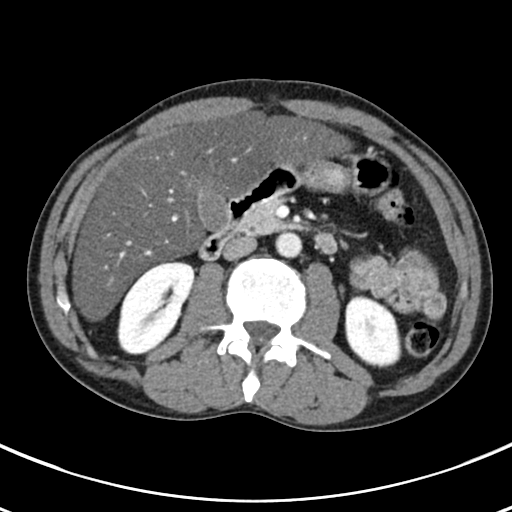
[im 65/80  soft-tissue]
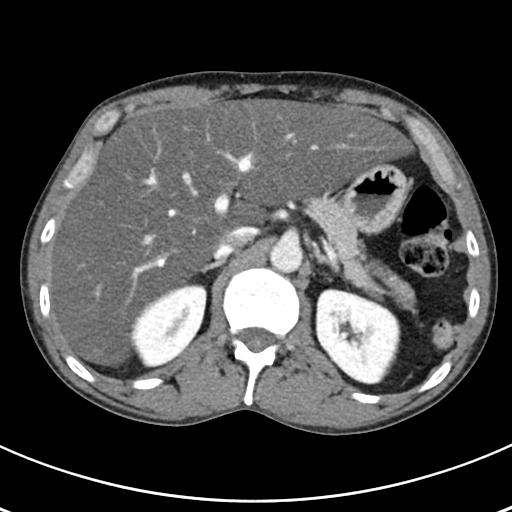
[im 70/80  soft-tissue]
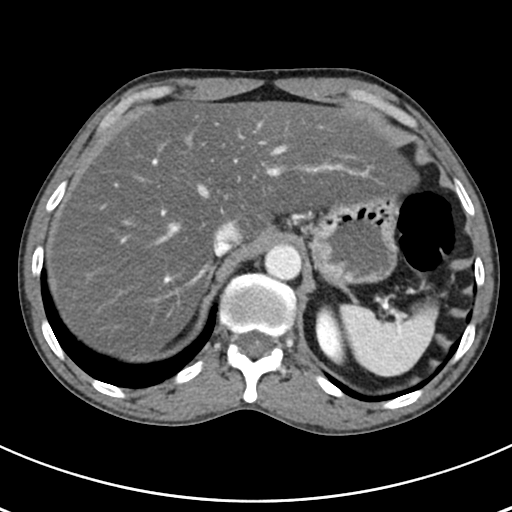
[im 75/80  soft-tissue]
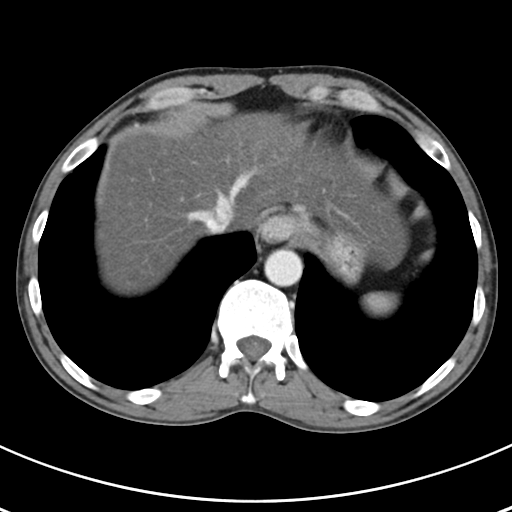

[Series 6: abdomen 3.0 mpr cor · coronal · 0.60mm/px · 3 of 80 slices shown]
[im 27/80  soft-tissue]
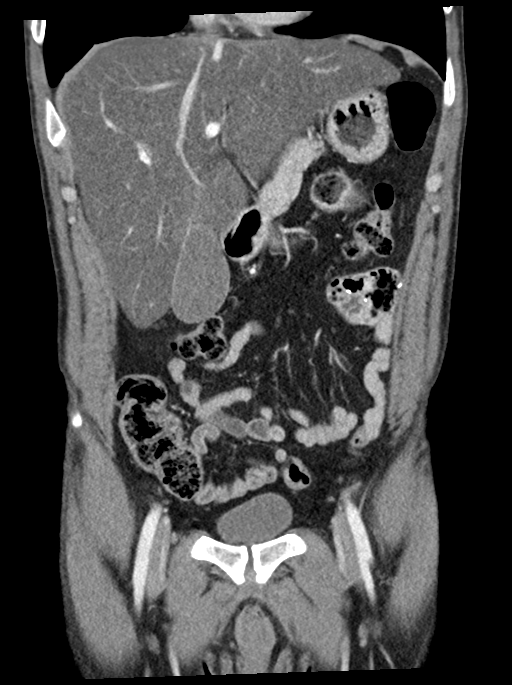
[im 36/80  soft-tissue]
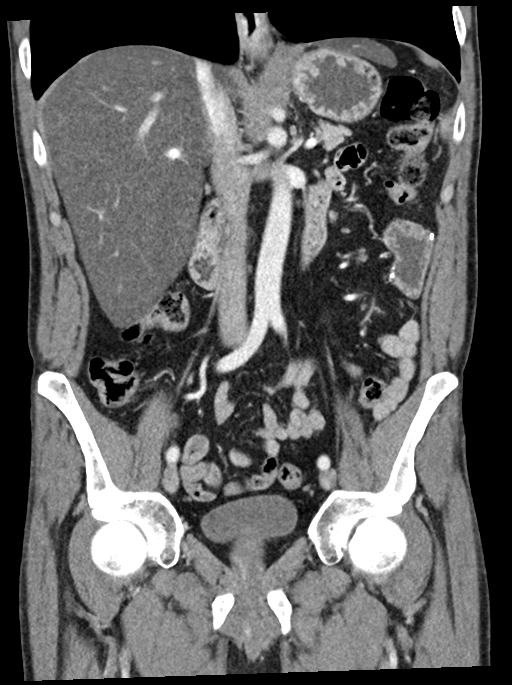
[im 44/80  soft-tissue]
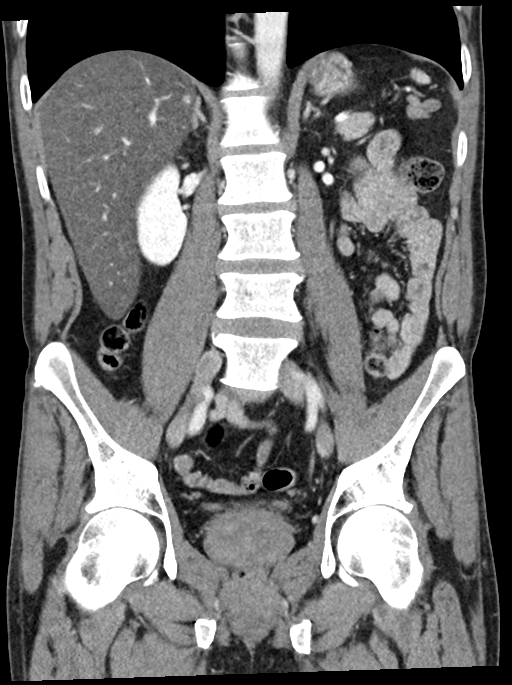

[16 of 46 positions shown; findings below may reference images not displayed]

FINDINGS: Lower chest: The visualized lung bases are clear.

No intra-abdominal free air or free fluid.

Hepatobiliary: Severe fatty liver. No intrahepatic biliary ductal
dilatation. The gallbladder is unremarkable.

Pancreas: Unremarkable. No pancreatic ductal dilatation or
surrounding inflammatory changes.

Spleen: Normal in size without focal abnormality.

Adrenals/Urinary Tract: The adrenal glands are unremarkable. The
kidneys, visualized ureters, and urinary bladder appear
unremarkable.

Stomach/Bowel: Postsurgical changes of small bowel with anastomotic
suture in the left hemiabdomen. There is no bowel obstruction or
active inflammation. The appendix is normal.

Vascular/Lymphatic: The abdominal aorta and IVC are unremarkable. No
venous gas. There is no adenopathy.

Reproductive: Prostate and seminal vesicles are grossly
unremarkable. No pelvic mass.

Other: None

Musculoskeletal: No acute or significant osseous findings.
IMPRESSION: 1. No acute intra-abdominal or pelvic pathology.
2. Severe fatty liver.
